# Patient Record
Sex: Female | Born: 1950 | Race: Black or African American | Hispanic: No | Marital: Single | Smoking: Former smoker
Health system: Southern US, Community
[De-identification: ages and names within clinical notes are randomized; demographics above are authoritative.]

## PROBLEM LIST (undated history)

## (undated) DIAGNOSIS — F200 Paranoid schizophrenia: Secondary | ICD-10-CM

## (undated) DIAGNOSIS — I10 Essential (primary) hypertension: Secondary | ICD-10-CM

## (undated) DIAGNOSIS — F319 Bipolar disorder, unspecified: Secondary | ICD-10-CM

---

## 1986-03-01 HISTORY — PX: BREAST LUMPECTOMY: SHX2

## 2014-10-18 ENCOUNTER — Emergency Department (HOSPITAL_COMMUNITY)
Admission: EM | Admit: 2014-10-18 | Discharge: 2014-10-24 | Payer: Self-pay | Attending: Emergency Medicine | Admitting: Emergency Medicine

## 2014-10-18 ENCOUNTER — Encounter (HOSPITAL_COMMUNITY): Payer: Self-pay | Admitting: Emergency Medicine

## 2014-10-18 DIAGNOSIS — Z049 Encounter for examination and observation for unspecified reason: Secondary | ICD-10-CM

## 2014-10-18 DIAGNOSIS — Z0389 Encounter for observation for other suspected diseases and conditions ruled out: Secondary | ICD-10-CM | POA: Insufficient documentation

## 2014-10-18 DIAGNOSIS — F200 Paranoid schizophrenia: Secondary | ICD-10-CM | POA: Diagnosis present

## 2014-10-18 DIAGNOSIS — Z8659 Personal history of other mental and behavioral disorders: Secondary | ICD-10-CM | POA: Insufficient documentation

## 2014-10-18 HISTORY — DX: Bipolar disorder, unspecified: F31.9

## 2014-10-18 LAB — COMPREHENSIVE METABOLIC PANEL
ALK PHOS: 83 U/L (ref 38–126)
ALT: 19 U/L (ref 14–54)
AST: 28 U/L (ref 15–41)
Albumin: 4.1 g/dL (ref 3.5–5.0)
Anion gap: 10 (ref 5–15)
BILIRUBIN TOTAL: 1.1 mg/dL (ref 0.3–1.2)
BUN: 20 mg/dL (ref 6–20)
CHLORIDE: 112 mmol/L — AB (ref 101–111)
CO2: 23 mmol/L (ref 22–32)
CREATININE: 1.29 mg/dL — AB (ref 0.44–1.00)
Calcium: 9.7 mg/dL (ref 8.9–10.3)
GFR calc Af Amer: 55 mL/min — ABNORMAL LOW (ref >60)
GFR, EST NON AFRICAN AMERICAN: 48 mL/min — AB (ref >60)
Glucose, Bld: 122 mg/dL — ABNORMAL HIGH (ref 65–99)
POTASSIUM: 3.8 mmol/L (ref 3.5–5.1)
Sodium: 145 mmol/L (ref 135–145)
Total Protein: 7.8 g/dL (ref 6.5–8.1)

## 2014-10-18 LAB — CBC
HCT: 38.7 % (ref 36.0–46.0)
HEMOGLOBIN: 13.2 g/dL (ref 12.0–15.0)
MCH: 27 pg (ref 26.0–34.0)
MCHC: 34.1 g/dL (ref 30.0–36.0)
MCV: 79.1 fL (ref 78.0–100.0)
Platelets: 145 10*3/uL — ABNORMAL LOW (ref 150–400)
RBC: 4.89 MIL/uL (ref 3.87–5.11)
RDW: 14.3 % (ref 11.5–15.5)
WBC: 7.2 10*3/uL (ref 4.0–10.5)

## 2014-10-18 LAB — ACETAMINOPHEN LEVEL: Acetaminophen (Tylenol), Serum: <10 ug/mL — ABNORMAL LOW (ref 10–30)

## 2014-10-18 LAB — ETHANOL: Alcohol, Ethyl (B): <5 mg/dL (ref <5)

## 2014-10-18 LAB — SALICYLATE LEVEL: Salicylate Lvl: <4 mg/dL (ref 2.8–30.0)

## 2014-10-18 MED ORDER — ZIPRASIDONE MESYLATE 20 MG IM SOLR
20.0000 mg | Freq: Once | INTRAMUSCULAR | Status: AC
  Administered 2014-10-18: 20 mg via INTRAMUSCULAR
  Filled 2014-10-18: qty 20

## 2014-10-18 MED ORDER — SODIUM CHLORIDE 0.9 % IJ SOLN
INTRAMUSCULAR | Status: AC
  Administered 2014-10-18: 1.2 mL
  Filled 2014-10-18: qty 10

## 2014-10-18 NOTE — ED Notes (Signed)
Bed: WA10 Expected date:  Expected time:  Means of arrival:  Comments: 

## 2014-10-18 NOTE — BH Assessment (Signed)
Assessment completed. Consulted Hulan Fess, NP who recommended inpatient treatment. TTS will contact facilities for placement. Attempted to contact Dr. Anitra Lauth but disposition message was left with Lorina Rabon, RN.

## 2014-10-18 NOTE — ED Notes (Signed)
Pt screaming about her vagina, and HIV/AIDS, said she was going to kick this nurses "ass".  Screaming at everyone. Being non cooperative.

## 2014-10-18 NOTE — ED Notes (Signed)
No blood draw from lab due to pt screaming and potiential violent

## 2014-10-18 NOTE — Progress Notes (Signed)
Patient listed as not having a pcp or insurance.  Currently patient is screaming in her room and making threats to staff.  EDCM will not see this patient at this time.

## 2014-10-18 NOTE — ED Provider Notes (Signed)
CSN: [id number]     Arrival date & time 10/18/14  1611 History   First MD Initiated Contact with Patient 10/18/14 1626     Chief Complaint  Patient presents with  . IVC      (Consider location/radiation/quality/duration/timing/severity/associated sxs/prior Treatment) HPI Comments: Patient being brought in by police under IVC commitment. She was attempting to bite the officers when they were trying to restrain her. She is verbally threatening to hurt others. She is currently noncooperative and screaming at everyone. Her son took out IVC paperwork for erratic behavior and concern that she may hurt herself or others. Based on the paperwork she talks to herself constantly making verbal threats. Previously she was committed in [facility] and she currently is taking no medication. Police found her walking down the street yelling and disturbing neighbors for no apparent reason.  The history is provided by the police and medical records. The history is limited by the condition of the patient.    Past Medical History  Diagnosis Date  . Bipolar 1 disorder    History reviewed. No pertinent past surgical history. No family history on file. Social History  Substance Use Topics  . Smoking status: None  . Smokeless tobacco: None  . Alcohol Use: None   OB History    No data available     Review of Systems  All other systems reviewed and are negative.     Allergies  Review of patient's allergies indicates not on file.  Home Medications   Prior to Admission medications   Not on File   BP 195/97 mmHg  Pulse 140  Temp(Src) 99.3 F (37.4 C) (Oral)  Resp 22  SpO2 100% Physical Exam  Constitutional: She appears well-developed and well-nourished.  Patient is angry, violent, refusing to let me do an exam  HENT:  Head: Normocephalic and atraumatic.  Mouth/Throat: Oropharynx is clear and moist.  Eyes: Conjunctivae and EOM are normal. Pupils are equal, round, and reactive  to light.  Neck: Normal range of motion. Neck supple.  Cardiovascular: Tachycardia present.   Pulmonary/Chest: Effort normal.  Musculoskeletal: Normal range of motion. She exhibits no edema or tenderness.  Neurological: She is alert. Gait abnormal.  Skin: Skin is warm and dry. No rash noted. No erythema.  Psychiatric: She has a normal mood and affect. Her speech is rapid and/or pressured and tangential. She is agitated, aggressive and actively hallucinating. She expresses impulsivity. She is noncommunicative.  Patient is sitting in the room talking with herself screaming at anyone comes into the room. Aggressively lunging at people when attempting to bite police officers.  Nursing note and vitals reviewed.   ED Course  Procedures (including critical care time) Labs Review Labs Reviewed  COMPREHENSIVE METABOLIC PANEL - Abnormal; Notable for the following:    Chloride 112 (*)    Glucose, Bld 122 (*)    Creatinine, Ser 1.29 (*)    GFR calc non Af Amer 48 (*)    GFR calc Af Amer 55 (*)    All other components within normal limits  ACETAMINOPHEN LEVEL - Abnormal; Notable for the following:    Acetaminophen (Tylenol), Serum <10 (*)    All other components within normal limits  CBC - Abnormal; Notable for the following:    Platelets 145 (*)    All other components within normal limits  ETHANOL  SALICYLATE LEVEL  URINE RAPID DRUG SCREEN, HOSP PERFORMED  POC URINE PREG, ED    Imaging Review No results found. I  have personally reviewed and evaluated these images and lab results as part of my medical decision-making.   EKG Interpretation None      MDM   Final diagnoses:  None    Patient is here under involuntary commitment by her son for erratic behavior and the potential for harming herself or others. Patient is here screaming and yelling and refusing to answer any questions. He appears to be having a psychotic episode and is unable to be redirected.  Patient will begin Geodon  as at this time we are unable to obtain any blood work or history. She is threatening nurses and aggressive.  8:29 PM Patient is more calm after receiving Geodon. Labs were obtained which appeared to be unremarkable. TTS consult pending and psych holding orders placed  Gwyneth Sprout, MD 10/18/14 2030

## 2014-10-18 NOTE — ED Notes (Signed)
Pt is verbally aggressive and making threats toward staff, security, and [employer] officers.  Pt attempted to bite officers as they were restraining her.  Pt's arms currently being handcuffed to bed.

## 2014-10-18 NOTE — BH Assessment (Addendum)
Tele Assessment Note   Jodi Evans is an 64 y.o. female presenting to [facility] unaccompanied after being petitioned by her son for involuntary committed due to displaying erratic behaviors and making verbal threats. Pt did not actively participate in this assessment. After responding "no" to this writing inquiring about suicidal ideations pt placed the blanket under her neck and did not respond to any additional questions. This Clinical research associate was unable to gather information from the petitioner at this time. A voice message was left for a return phone call.   Axis I: Bipolar 1 disorder by hx.   Past Medical History:  Past Medical History  Diagnosis Date  . Bipolar 1 disorder     History reviewed. No pertinent past surgical history.  Family History: No family history on file.  Social History:  has no tobacco, alcohol, and drug history on file.  Additional Social History:  Alcohol / Drug Use History of alcohol / drug use?:  (Unable to assess )  CIWA: CIWA-Ar BP: 116/67 mmHg Pulse Rate: 73 COWS:    PATIENT STRENGTHS: (choose at least two) Average or above average intelligence  Allergies: Allergies not on file  Home Medications:  (Not in a hospital admission)  OB/GYN Status:  No LMP recorded.  General Assessment Data Location of Assessment: [facility] TTS Assessment: In system Is this a Tele or Face-to-Face Assessment?: Face-to-Face Is this an Initial Assessment or a Re-assessment for this encounter?: Initial Assessment Marital status:  (Unable to assess ) Maiden name:  (Unable to assess ) Is patient pregnant?:  (Unable to assess ) Pregnancy Status: Unable to assess Living Arrangements:  (Unable to assess ) Can pt return to current living arrangement?:  (Unable to assess ) Admission Status: Involuntary Is patient capable of signing voluntary admission?:  (Unable to assess ) Referral Source: Self/Family/Friend Insurance type: Unknown      Crisis Care Plan Living Arrangements:   (Unable to assess ) Name of Psychiatrist:  (Unable to assess ) Name of Therapist:  (Unable to assess )  Education Status Is patient currently in school?: No Current Grade: N/A Highest grade of school patient has completed: N/A Name of school: N/A Contact person: N/A  Risk to self with the past 6 months Suicidal Ideation: No Has patient been a risk to self within the past 6 months prior to admission? :  (Unable to assess ) Suicidal Intent:  (Unable to assess ) Has patient had any suicidal intent within the past 6 months prior to admission? :  (Unable to assess ) Is patient at risk for suicide?:  (Unable to assess ) Suicidal Plan?:  (Unable to assess ) Has patient had any suicidal plan within the past 6 months prior to admission? :  (Unable to assess ) Access to Means:  (Unable to assess ) What has been your use of drugs/alcohol within the last 12 months?:  (Unable to assess ) Previous Attempts/Gestures:  (Unable to assess ) How many times?:  (Unable to assess ) Other Self Harm Risks:  (Unable to assess ) Triggers for Past Attempts:  (Unable to assess ) Intentional Self Injurious Behavior:  (Unable to assess ) Family Suicide History: Unable to assess Recent stressful life event(s):  (Unable to assess ) Persecutory voices/beliefs?:  (Unable to assess ) Depression:  (Unable to assess ) Depression Symptoms:  (Unable to assess ) Substance abuse history and/or treatment for substance abuse?:  (Unable to assess )  Risk to Others within the past 6 months Homicidal Ideation:  (Unable  to assess ) Does patient have any lifetime risk of violence toward others beyond the six months prior to admission? :  (Unable to assess ) Thoughts of Harm to Others:  (Unable to assess ) Current Homicidal Intent:  (Unable to assess ) Current Homicidal Plan:  (Unable to assess ) Access to Homicidal Means:  (Unable to assess ) Identified Victim:  (Unable to assess ) History of harm to others?:  (Unable to  assess ) Assessment of Violence: None Noted Violent Behavior Description: No violent behaviors observed at this time. Pt is drowsy.  Does patient have access to weapons?:  (Unable to assess ) Criminal Charges Pending?:  (Unable to assess) Describe Pending Criminal Charges:  (Unable to assess ) Does patient have a court date:  (Unknown ) Is patient on probation?: Unknown  Psychosis Hallucinations:  (Unable to assess ) Delusions:  (Unable to assess )  Mental Status Report Appearance/Hygiene: In scrubs Eye Contact: Poor Motor Activity: Unable to assess Speech: Unable to assess Level of Consciousness: Drowsy (It has been reported that pt was given Geodon at 1652. ) Mood:  (Unable to assess ) Affect: Unable to Assess Anxiety Level:  (Unable to assess ) Thought Processes: Unable to Assess Judgement: Unable to Assess Orientation: Unable to assess Obsessive Compulsive Thoughts/Behaviors: Unable to Assess  Cognitive Functioning Concentration: Unable to Assess Memory: Unable to Assess IQ:  (Unable to assess ) Insight: Unable to Assess Impulse Control: Unable to Assess Appetite:  (Unable to assess ) Weight Loss:  (Unable to assess ) Weight Gain:  (Unable to assess ) Sleep: Unable to Assess Total Hours of Sleep:  (Unable to assess ) Vegetative Symptoms: Unable to Assess  ADLScreening [facility] Assessment Services) Patient's cognitive ability adequate to safely complete daily activities?:  (Unable to assess ) Patient able to express need for assistance with ADLs?:  (Unable to assess ) Independently performs ADLs?:  (Unable to assess )  Prior Inpatient Therapy Prior Inpatient Therapy:  (Unable to assess ) Prior Therapy Dates:  (Unable to assess ) Prior Therapy Facilty/Provider(s):  (Unable to assess ) Reason for Treatment:  (Unable to assess )  Prior Outpatient Therapy Prior Outpatient Therapy:  (Unable to assess ) Prior Therapy Dates:  (Unable to assess ) Prior Therapy  Facilty/Provider(s):  (Unable to assess) Reason for Treatment:  (Unable to assess ) Does patient have an ACCT team?: Unknown Does patient have Intensive In-House Services?  : No Does patient have [facility] services? : Unknown Does patient have P4CC services?: Unknown  ADL Screening (condition at time of admission) Patient's cognitive ability adequate to safely complete daily activities?:  (Unable to assess ) Patient able to express need for assistance with ADLs?:  (Unable to assess ) Independently performs ADLs?:  (Unable to assess )       Abuse/Neglect Assessment (Assessment to be complete while patient is alone) Physical Abuse:  (Unable to assess ) Verbal Abuse:  (Unable to assess ) Sexual Abuse:  (Unable to assess ) Exploitation of patient/patient's resources:  (Unable to assess ) Self-Neglect:  (Unable to assess )     Advance Directives (For Healthcare) Does patient have an advance directive?: No Would patient like information on creating an advanced directive?: No - patient declined information    Additional Information 1:1 In Past 12 Months?: No CIRT Risk: No Elopement Risk: No Does patient have medical clearance?: Yes     Disposition:  Disposition Initial Assessment Completed for this Encounter: Yes  Markell Schrier S 10/18/2014 9:14 PM

## 2014-10-18 NOTE — ED Notes (Signed)
Pt brought in by [employer] under IVC papers that were taken out by pt's son.   Papers state: "she is a danger to harm herself and or others.  Erratic behavior could potentially be harmful to herself or others.  She talks to herself constantly.  Makes verbal threats.  Bi-polar.  She has been previously committed in [place], [place].  She will not take her medication that she needs.  Police were called on her because of her yelling and disturbing the neighbors for no apparent reason."

## 2014-10-19 DIAGNOSIS — F29 Unspecified psychosis not due to a substance or known physiological condition: Secondary | ICD-10-CM

## 2014-10-19 DIAGNOSIS — F200 Paranoid schizophrenia: Secondary | ICD-10-CM | POA: Diagnosis present

## 2014-10-19 LAB — RAPID URINE DRUG SCREEN, HOSP PERFORMED
AMPHETAMINES: NOT DETECTED
BENZODIAZEPINES: NOT DETECTED
Barbiturates: NOT DETECTED
Cocaine: NOT DETECTED
OPIATES: NOT DETECTED
TETRAHYDROCANNABINOL: NOT DETECTED

## 2014-10-19 LAB — PREGNANCY, URINE: PREG TEST UR: NEGATIVE

## 2014-10-19 MED ORDER — LORAZEPAM 1 MG PO TABS
1.0000 mg | ORAL_TABLET | Freq: Three times a day (TID) | ORAL | Status: DC | PRN
  Filled 2014-10-19 (×2): qty 1

## 2014-10-19 MED ORDER — STERILE WATER FOR INJECTION IJ SOLN
INTRAMUSCULAR | Status: AC
  Administered 2014-10-19: 08:00:00
  Filled 2014-10-19: qty 10

## 2014-10-19 MED ORDER — ONDANSETRON HCL 4 MG PO TABS: 4.0000 mg | ORAL_TABLET | Freq: Three times a day (TID) | ORAL | Status: DC | PRN

## 2014-10-19 MED ORDER — ALUM & MAG HYDROXIDE-SIMETH 200-200-20 MG/5ML PO SUSP: 30.0000 mL | ORAL | Status: DC | PRN

## 2014-10-19 MED ORDER — ZIPRASIDONE MESYLATE 20 MG IM SOLR: 20.0000 mg | Freq: Once | INTRAMUSCULAR | Status: DC

## 2014-10-19 MED ORDER — ZIPRASIDONE MESYLATE 20 MG IM SOLR
20.0000 mg | Freq: Once | INTRAMUSCULAR | Status: AC
  Administered 2014-10-19: 20 mg via INTRAMUSCULAR
  Filled 2014-10-19: qty 20

## 2014-10-19 NOTE — ED Notes (Signed)
Per Nicanor Alcon, MD due to pt's quickly escalating behavior, will medicate pt and obtain EKG afterwards when pt is calmer. Pt yelling at staff, pacing, raising arms and fists to staff and telling staff that "you are not part of my 1970 student class, so get out." Pt manipulative, refusing to speak with certain staff members after they upset her.

## 2014-10-19 NOTE — Consult Note (Addendum)
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   Total Time spent with patient: 30 minutes  Subjective:   Jodi Evans is a 64 y.o. female patient admitted with threatening behavior.  HPI:  Patient is 64 year old African-American female who was admitted under IVC petition by her son as patient is displaying erratic behavior and making verbal threats.  Patient appears very psychotic, paranoid and labile.  She did not provide any information but mentioned police took her for no reason.  She was noticed giggling and laughing and making gestures.  She responded to the question with either yes or no reply.  Staff also tried to get information however unsuccessful.  A voicemail message was left to her son for more information.  Past Medical History:  Past Medical History  Diagnosis Date  . Bipolar 1 disorder    History reviewed. No pertinent past surgical history. Family History: No family history on file. Social History:  History  Alcohol Use: Not on file     History  Drug Use Not on file    Social History   Social History  . Marital Status: Single    Spouse Name: N/A  . Number of Children: N/A  . Years of Education: N/A   Social History Main Topics  . Smoking status: None  . Smokeless tobacco: None  . Alcohol Use: None  . Drug Use: None  . Sexual Activity: Not Asked   Other Topics Concern  . None   Social History Narrative  . None   Additional Social History:    History of alcohol / drug use?:  (Unable to assess )                     Allergies:   Allergies  Allergen Reactions  . Shellfish Allergy     Labs:  Results for orders placed or performed during the hospital  encounter of 10/18/14 (from the past 48 hour(s))  Comprehensive metabolic panel     Status: Abnormal   Collection Time: 10/18/14  4:15 PM  Result Value Ref Range   Sodium 145 135 - 145 mmol/L   Potassium 3.8 3.5 - 5.1 mmol/L   Chloride 112 (H) 101 - 111 mmol/L   CO2 23 22 - 32 mmol/L   Glucose, Bld 122 (H) 65 - 99 mg/dL   BUN 20 6 - 20 mg/dL   Creatinine, Ser 1.29 (H) 0.44 - 1.00 mg/dL   Calcium 9.7 8.9 - 10.3 mg/dL   Total Protein 7.8 6.5 - 8.1 g/dL   Albumin 4.1 3.5 - 5.0 g/dL   AST 28 15 - 41 U/L   ALT 19 14 - 54 U/L   Alkaline Phosphatase 83 38 - 126 U/L   Total Bilirubin 1.1 0.3 - 1.2 mg/dL   GFR calc non Af Amer 48 (L) >60 mL/min   GFR calc Af Amer 55 (L) >60 mL/min    Comment: (NOTE) The eGFR has been calculated using the CKD EPI equation. This calculation has not been validated in all clinical situations. eGFR's persistently <60 mL/min signify possible Chronic Kidney Disease.    Anion gap 10 5 - 15  CBC     Status: Abnormal   Collection Time: 10/18/14  4:15 PM  Result Value Ref Range  WBC 7.2 4.0 - 10.5 K/uL   RBC 4.89 3.87 - 5.11 MIL/uL   Hemoglobin 13.2 12.0 - 15.0 g/dL   HCT 38.7 36.0 - 46.0 %   MCV 79.1 78.0 - 100.0 fL   MCH 27.0 26.0 - 34.0 pg   MCHC 34.1 30.0 - 36.0 g/dL   RDW 14.3 11.5 - 15.5 %   Platelets 145 (L) 150 - 400 K/uL    Comment: SPECIMEN CHECKED FOR CLOTS REPEATED TO VERIFY   Ethanol (ETOH)     Status: None   Collection Time: 10/18/14  6:46 PM  Result Value Ref Range   Alcohol, Ethyl (B) <5 <5 mg/dL    Comment:        LOWEST DETECTABLE LIMIT FOR SERUM ALCOHOL IS 5 mg/dL FOR MEDICAL PURPOSES ONLY   Salicylate level     Status: None   Collection Time: 10/18/14  6:46 PM  Result Value Ref Range   Salicylate Lvl <4.1 2.8 - 30.0 mg/dL  Acetaminophen level     Status: Abnormal   Collection Time: 10/18/14  6:46 PM  Result Value Ref Range   Acetaminophen (Tylenol), Serum <10 (L) 10 - 30 ug/mL    Comment:        THERAPEUTIC CONCENTRATIONS  VARY SIGNIFICANTLY. A RANGE OF 10-30 ug/mL MAY BE AN EFFECTIVE CONCENTRATION FOR MANY PATIENTS. HOWEVER, SOME ARE BEST TREATED AT CONCENTRATIONS OUTSIDE THIS RANGE. ACETAMINOPHEN CONCENTRATIONS >150 ug/mL AT 4 HOURS AFTER INGESTION AND >50 ug/mL AT 12 HOURS AFTER INGESTION ARE OFTEN ASSOCIATED WITH TOXIC REACTIONS.   Urine rapid drug screen (hosp performed) (Not at [facility])     Status: None   Collection Time: 10/19/14 10:00 AM  Result Value Ref Range   Opiates NONE DETECTED NONE DETECTED   Cocaine NONE DETECTED NONE DETECTED   Benzodiazepines NONE DETECTED NONE DETECTED   Amphetamines NONE DETECTED NONE DETECTED   Tetrahydrocannabinol NONE DETECTED NONE DETECTED   Barbiturates NONE DETECTED NONE DETECTED    Comment:        DRUG SCREEN FOR MEDICAL PURPOSES ONLY.  IF CONFIRMATION IS NEEDED FOR ANY PURPOSE, NOTIFY LAB WITHIN 5 DAYS.        LOWEST DETECTABLE LIMITS FOR URINE DRUG SCREEN Drug Class       Cutoff (ng/mL) Amphetamine      1000 Barbiturate      200 Benzodiazepine   962 Tricyclics       229 Opiates          300 Cocaine          300 THC              50   Pregnancy, urine     Status: None   Collection Time: 10/19/14 10:00 AM  Result Value Ref Range   Preg Test, Ur NEGATIVE NEGATIVE    Comment:        THE SENSITIVITY OF THIS METHODOLOGY IS >20 mIU/mL.     Vitals: Blood pressure 146/90, pulse 86, temperature 97.6 F (36.4 C), temperature source Oral, resp. rate 18, SpO2 100 %.  Risk to Self: Suicidal Ideation: No Suicidal Intent:  (Unable to assess ) Is patient at risk for suicide?:  (Unable to assess ) Suicidal Plan?:  (Unable to assess ) Access to Means:  (Unable to assess ) What has been your use of drugs/alcohol within the last 12 months?:  (Unable to assess ) How many times?:  (Unable to assess ) Other Self Harm Risks:  (Unable to assess ) Triggers for  Past Attempts:  (Unable to assess ) Intentional Self Injurious Behavior:  (Unable to assess ) Risk  to Others: Homicidal Ideation:  (Unable to assess ) Thoughts of Harm to Others:  (Unable to assess ) Current Homicidal Intent:  (Unable to assess ) Current Homicidal Plan:  (Unable to assess ) Access to Homicidal Means:  (Unable to assess ) Identified Victim:  (Unable to assess ) History of harm to others?:  (Unable to assess ) Assessment of Violence: None Noted Violent Behavior Description: No violent behaviors observed at this time. Pt is drowsy.  Does patient have access to weapons?:  (Unable to assess ) Criminal Charges Pending?:  (Unable to assess) Describe Pending Criminal Charges:  (Unable to assess ) Does patient have a court date:  (Unknown ) Prior Inpatient Therapy: Prior Inpatient Therapy:  (Unable to assess ) Prior Therapy Dates:  (Unable to assess ) Prior Therapy Facilty/Provider(s):  (Unable to assess ) Reason for Treatment:  (Unable to assess ) Prior Outpatient Therapy: Prior Outpatient Therapy:  (Unable to assess ) Prior Therapy Dates:  (Unable to assess ) Prior Therapy Facilty/Provider(s):  (Unable to assess) Reason for Treatment:  (Unable to assess ) Does patient have an ACCT team?: Unknown Does patient have Intensive In-House Services?  : No Does patient have [facility] services? : Unknown Does patient have P4CC services?: Unknown  Current Facility-Administered Medications  Medication Dose Route Frequency Provider Last Rate Last Dose  . alum & mag hydroxide-simeth (MAALOX/MYLANTA) 200-200-20 MG/5ML suspension 30 mL  30 mL Oral PRN April Palumbo, MD      . LORazepam (ATIVAN) tablet 1 mg  1 mg Oral Q8H PRN April Palumbo, MD      . ondansetron [facility]) tablet 4 mg  4 mg Oral Q8H PRN April Palumbo, MD       No current outpatient prescriptions on file.    Musculoskeletal: Strength & Muscle Tone: decreased Gait & Station: normal Patient leans: N/A  Psychiatric Specialty Exam: Physical Exam  ROS  Blood pressure 146/90, pulse 86, temperature 97.6 F (36.4 C),  temperature source Oral, resp. rate 18, SpO2 100 %.There is no height or weight on file to calculate BMI.  General Appearance: Bizarre, Disheveled and Guarded  Eye Contact::  Poor  Speech:  Slow and Slurred  Volume:  Decreased  Mood:  Irritable  Affect:  Inappropriate and Labile  Thought Process:  Circumstantial, Disorganized, Irrelevant, Loose and Tangential  Orientation:  Negative  Thought Content:  Hallucinations: Responding to internal stimuli, Rumination and Inappropriate gesture with laughing and giggling  Suicidal Thoughts:  No  Homicidal Thoughts:  Denies  Memory:  Immediate;   Poor Recent;   Poor Remote;   Poor  Judgement:  Impaired  Insight:  Lacking  Psychomotor Activity:  Decreased and Restlessness  Concentration:  Poor  Recall:  Poor  Fund of Knowledge:Poor  Language: Poor  Akathisia:  No  Handed:  Right  AIMS (if indicated):     Assets:  Housing  ADL's:  Impaired  Cognition: Impaired,  Moderate  Sleep:      Medical Decision Making: Review of Psycho-Social Stressors (1), Review or order clinical lab tests (1), Decision to obtain old records (1), Review and summation of old records (2), Established Problem, Worsening (2) and Review of Medication Regimen & Side Effects (2)  Treatment Plan Summary: Medication management and Patient requires inpatient psychiatric services.  Increase collateral information from her son.  Plan:  Recommend psychiatric Inpatient admission when medically cleared. review blood work results  and current medication. Disposition: Inpatient psychiatric services for stabilization and treatment for the psychosis.  Quiera Diffee T. 10/19/2014 2:39 PM

## 2014-10-19 NOTE — Progress Notes (Addendum)
Pt was brought over from the main ER. She appears very dishelved and manic.Pt stated,"I was yelling at home and the police showed up." Pt is cooperative and pleasant and did permit the nurse to take her BP and temp. She does contract for safety and denies SI and HI. 6pm-Pt laughs inappropraitely . She denies hearing any voices and when asked states, "only your voice." pt has been sitting in the hall with her 1;1 bedside her.

## 2014-10-19 NOTE — ED Notes (Signed)
Attempted to take patients vital signs again, she let me put the blood pressure cuff on her arm but then told me to take it off and started to raise her voice and hitting at the cuff. While I was in the room she was also having a conversation with someone who wasn't there.

## 2014-10-19 NOTE — ED Notes (Signed)
Resting quietly with eyes closed. Easily arousable. Verbally responsive. Resp even and unlabored. ABC's intact. No behavior problems noted. NAD noted. Sitter at bedside. 

## 2014-10-19 NOTE — Progress Notes (Signed)
Disposition CSW completed patient referrals to the following inpatient psych facilities:  [facility]. [facility]  CSW will continue to assist with patient placement.  [facility] Spe[facility] Disposition CSW [phone]

## 2014-10-19 NOTE — ED Notes (Signed)
Pt refused to have vital's taken said she had them taken earlier and doesn't want them taken again. Also referred to herself as "Dr. Dareen Piano".

## 2014-10-19 NOTE — ED Notes (Signed)
Pt was instructed to leave urine in hat but pt hurriedly took hat off commode and dumped her urine out.

## 2014-10-19 NOTE — ED Notes (Signed)
Meds were given. Once pt calms, EKG and updated vitals with be done. Will also notify pt we need urine sample.

## 2014-10-19 NOTE — ED Notes (Signed)
Resting quietly with eye closed. Easily arousable. Verbally responsive. Resp even and unlabored. ABC's intact. No behavior problems noted. NAD noted. Sitter at bedside. Pt awaken to speak with psych MD and mental health.

## 2014-10-19 NOTE — ED Notes (Signed)
Awake. Verbally responsive. Resp even and unlabored. ABC's intact. Pt refusing to speak with staff and stated that she will only speak with the doctor. Pt OOB sitting in chair. Sitter at bedside.

## 2014-10-19 NOTE — ED Notes (Signed)
Called report to Marylu Lund, Charity fundraiser. Pt moved to TCU 31.

## 2014-10-19 NOTE — ED Provider Notes (Signed)
9:29 AM Patient resting comfortably after geodon. EKG shows no acute ischemia. QTC 444   EKG Interpretation  Date/Time:  Friday October 18 2014 19:29:51 EDT Ventricular Rate:  68 PR Interval:  190 QRS Duration: 87 QT Interval:  417 QTC Calculation: 443 R Axis:   19 Text Interpretation:  Sinus rhythm Borderline T wave abnormalities No old tracing to compare Confirmed by Ivannah Zody  MD, Laurieanne Galloway ([id number]) on 10/19/2014 9:29:04 AM        Pricilla Loveless, MD 10/19/14 [phone]

## 2014-10-20 DIAGNOSIS — F29 Unspecified psychosis not due to a substance or known physiological condition: Secondary | ICD-10-CM | POA: Insufficient documentation

## 2014-10-20 NOTE — ED Notes (Signed)
MD requested notify family for patient meds.  Spoke to son Junior and meds unknown.  However given number to sister who states last meds in Feb were cogentin, prozac and haldol IM.  Family did not know exact dosing or if meds were ever filled.  Pt was last seen in [place] in [facility].  Notified NP of findings.

## 2014-10-20 NOTE — ED Notes (Signed)
Pt still fully awake at this time--- coming in and out of room frequently.  Was offered Ativan to help her relax--- pt declined; stated, "I am fully relaxed".

## 2014-10-20 NOTE — ED Notes (Signed)
Pt. Noted sleeping in room. No complaints or concerns voiced. No distress or abnormal behavior noted. Will continue to monitor with security cameras. Q 15 minute rounds continue. 

## 2014-10-20 NOTE — Consult Note (Signed)
[facility] Follow Up Note     Patient Identification: Makayla Lanter MRN:  [id number] Principal Diagnosis: Psychosis, rule out schizophrenia chronic paranoid type Diagnosis:   Patient Active Problem List   Diagnosis Date Noted  . Psychosis [F29] 10/19/2014  . 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 female patient admitted with threatening behavior.  Objective: Patient seen chart reviewed.  Patient remains very guarded, paranoid and refused to cooperate to provide more information.  She's been refusing vitals and does not want to take any psychiatric medication.  When I ask why she was petitioned by her son, patient replied go ask him.  I asked if she has taken psychotrauma medication in the past, patient replied I was seeing someone at [facility] but I don't think I have any psychiatric problem.  Patient endorse that there is nothing wrong with her.  She minimizes the circumstances that led her to come emergency room.  Patient remains very labile, easily irritable, seen giggling and laughing to herself.  She is refusing to cooperate to provide more information.  We still waiting for collateral information from her son.  Past Medical History:  Past Medical History  Diagnosis Date  . Bipolar 1 disorder    History reviewed. No pertinent past surgical history. Family History: No family history on file. Social History:  History  Alcohol Use: Not on file     History  Drug Use Not on file    Social History   Social History  . Marital Status: Single    Spouse Name: N/A  . Number of Children: N/A  . Years of Education: N/A   Social History Main Topics  . Smoking status: None  . Smokeless tobacco: None  . Alcohol Use: None  . Drug Use: None  . Sexual Activity: Not Asked   Other Topics Concern  . None   Social History Narrative  . None   Additional Social History:    History of alcohol / drug use?:   (Unable to assess )                     Allergies:   Allergies  Allergen Reactions  . Shellfish Allergy     Labs:  Results for orders placed or performed during the hospital encounter of 10/18/14 (from the past 48 hour(s))  Comprehensive metabolic panel     Status: Abnormal   Collection Time: 10/18/14  4:15 PM  Result Value Ref Range   Sodium 145 135 - 145 mmol/L   Potassium 3.8 3.5 - 5.1 mmol/L   Chloride 112 (H) 101 - 111 mmol/L   CO2 23 22 - 32 mmol/L   Glucose, Bld 122 (H) 65 - 99 mg/dL   BUN 20 6 - 20 mg/dL   Creatinine, Ser 1.29 (H) 0.44 - 1.00 mg/dL   Calcium 9.7 8.9 - 10.3 mg/dL   Total Protein 7.8 6.5 - 8.1 g/dL   Albumin 4.1 3.5 - 5.0 g/dL   AST 28 15 - 41 U/L   ALT 19 14 - 54 U/L   Alkaline Phosphatase 83 38 - 126 U/L   Total Bilirubin 1.1 0.3 - 1.2 mg/dL   GFR calc non Af Amer 48 (L) >60 mL/min   GFR calc Af Amer 55 (L) >60 mL/min    Comment: (NOTE) The eGFR has been calculated using the CKD EPI equation. This calculation has not been validated  in all clinical situations. eGFR's persistently <60 mL/min signify possible Chronic Kidney Disease.    Anion gap 10 5 - 15  CBC     Status: Abnormal   Collection Time: 10/18/14  4:15 PM  Result Value Ref Range   WBC 7.2 4.0 - 10.5 K/uL   RBC 4.89 3.87 - 5.11 MIL/uL   Hemoglobin 13.2 12.0 - 15.0 g/dL   HCT 38.7 36.0 - 46.0 %   MCV 79.1 78.0 - 100.0 fL   MCH 27.0 26.0 - 34.0 pg   MCHC 34.1 30.0 - 36.0 g/dL   RDW 14.3 11.5 - 15.5 %   Platelets 145 (L) 150 - 400 K/uL    Comment: SPECIMEN CHECKED FOR CLOTS REPEATED TO VERIFY   Ethanol (ETOH)     Status: None   Collection Time: 10/18/14  6:46 PM  Result Value Ref Range   Alcohol, Ethyl (B) <5 <5 mg/dL    Comment:        LOWEST DETECTABLE LIMIT FOR SERUM ALCOHOL IS 5 mg/dL FOR MEDICAL PURPOSES ONLY   Salicylate level     Status: None   Collection Time: 10/18/14  6:46 PM  Result Value Ref Range   Salicylate Lvl <9.8 2.8 - 30.0 mg/dL  Acetaminophen  level     Status: Abnormal   Collection Time: 10/18/14  6:46 PM  Result Value Ref Range   Acetaminophen (Tylenol), Serum <10 (L) 10 - 30 ug/mL    Comment:        THERAPEUTIC CONCENTRATIONS VARY SIGNIFICANTLY. A RANGE OF 10-30 ug/mL MAY BE AN EFFECTIVE CONCENTRATION FOR MANY PATIENTS. HOWEVER, SOME ARE BEST TREATED AT CONCENTRATIONS OUTSIDE THIS RANGE. ACETAMINOPHEN CONCENTRATIONS >150 ug/mL AT 4 HOURS AFTER INGESTION AND >50 ug/mL AT 12 HOURS AFTER INGESTION ARE OFTEN ASSOCIATED WITH TOXIC REACTIONS.   Urine rapid drug screen (hosp performed) (Not at [facility])     Status: None   Collection Time: 10/19/14 10:00 AM  Result Value Ref Range   Opiates NONE DETECTED NONE DETECTED   Cocaine NONE DETECTED NONE DETECTED   Benzodiazepines NONE DETECTED NONE DETECTED   Amphetamines NONE DETECTED NONE DETECTED   Tetrahydrocannabinol NONE DETECTED NONE DETECTED   Barbiturates NONE DETECTED NONE DETECTED    Comment:        DRUG SCREEN FOR MEDICAL PURPOSES ONLY.  IF CONFIRMATION IS NEEDED FOR ANY PURPOSE, NOTIFY LAB WITHIN 5 DAYS.        LOWEST DETECTABLE LIMITS FOR URINE DRUG SCREEN Drug Class       Cutoff (ng/mL) Amphetamine      1000 Barbiturate      200 Benzodiazepine   338 Tricyclics       250 Opiates          300 Cocaine          300 THC              50   Pregnancy, urine     Status: None   Collection Time: 10/19/14 10:00 AM  Result Value Ref Range   Preg Test, Ur NEGATIVE NEGATIVE    Comment:        THE SENSITIVITY OF THIS METHODOLOGY IS >20 mIU/mL.     Vitals: Blood pressure 150/88, pulse 71, temperature 98.3 F (36.8 C), temperature source Oral, resp. rate 19, SpO2 100 %.  Risk to Self: Suicidal Ideation: No Suicidal Intent:  (Unable to assess ) Is patient at risk for suicide?:  (Unable to assess ) Suicidal Plan?:  (Unable to assess )  Access to Means:  (Unable to assess ) What has been your use of drugs/alcohol within the last 12 months?:  (Unable to assess ) How  many times?:  (Unable to assess ) Other Self Harm Risks:  (Unable to assess ) Triggers for Past Attempts:  (Unable to assess ) Intentional Self Injurious Behavior:  (Unable to assess ) Risk to Others: Homicidal Ideation:  (Unable to assess ) Thoughts of Harm to Others:  (Unable to assess ) Current Homicidal Intent:  (Unable to assess ) Current Homicidal Plan:  (Unable to assess ) Access to Homicidal Means:  (Unable to assess ) Identified Victim:  (Unable to assess ) History of harm to others?:  (Unable to assess ) Assessment of Violence: None Noted Violent Behavior Description: No violent behaviors observed at this time. Pt is drowsy.  Does patient have access to weapons?:  (Unable to assess ) Criminal Charges Pending?:  (Unable to assess) Describe Pending Criminal Charges:  (Unable to assess ) Does patient have a court date:  (Unknown ) Prior Inpatient Therapy: Prior Inpatient Therapy:  (Unable to assess ) Prior Therapy Dates:  (Unable to assess ) Prior Therapy Facilty/Provider(s):  (Unable to assess ) Reason for Treatment:  (Unable to assess ) Prior Outpatient Therapy: Prior Outpatient Therapy:  (Unable to assess ) Prior Therapy Dates:  (Unable to assess ) Prior Therapy Facilty/Provider(s):  (Unable to assess) Reason for Treatment:  (Unable to assess ) Does patient have an ACCT team?: Unknown Does patient have Intensive In-House Services?  : No Does patient have [facility] services? : Unknown Does patient have P4CC services?: Unknown  Current Facility-Administered Medications  Medication Dose Route Frequency Provider Last Rate Last Dose  . alum & mag hydroxide-simeth (MAALOX/MYLANTA) 200-200-20 MG/5ML suspension 30 mL  30 mL Oral PRN April Palumbo, MD      . LORazepam (ATIVAN) tablet 1 mg  1 mg Oral Q8H PRN April Palumbo, MD      . ondansetron [facility]) tablet 4 mg  4 mg Oral Q8H PRN April Palumbo, MD      . ziprasidone (GEODON) injection 20 mg  20 mg Intramuscular Once Lacretia Leigh, MD   Stopped at 10/19/14 2150   No current outpatient prescriptions on file.    Musculoskeletal: Strength & Muscle Tone: decreased Gait & Station: normal Patient leans: N/A  Psychiatric Specialty Exam: Physical Exam  ROS  Blood pressure 150/88, pulse 71, temperature 98.3 F (36.8 C), temperature source Oral, resp. rate 19, SpO2 100 %.There is no height or weight on file to calculate BMI.  General Appearance: Bizarre, Disheveled and Guarded  Eye Contact::  Fair  Speech:  Slow  Volume:  Decreased  Mood:  Irritable  Affect:  Inappropriate and Labile  Thought Process:  Circumstantial, Disorganized, Irrelevant, Loose and Tangential  Orientation:  Negative  Thought Content:  Hallucinations: Responding to internal stimuli, Rumination and Inappropriate gesture with laughing and giggling  Suicidal Thoughts:  No  Homicidal Thoughts:  Denies  Memory:  Immediate;   Poor Recent;   Poor Remote;   Poor  Judgement:  Impaired  Insight:  Lacking  Psychomotor Activity:  Decreased and Restlessness  Concentration:  Poor  Recall:  Poor  Fund of Knowledge:Poor  Language: Poor  Akathisia:  No  Handed:  Right  AIMS (if indicated):     Assets:  Housing  ADL's:  Impaired  Cognition: Impaired,  Moderate  Sleep:      Medical Decision Making: Review of Psycho-Social Stressors (1), Review or order  clinical lab tests (1), Decision to obtain old records (1), Review and summation of old records (2) and Review of Medication Regimen & Side Effects (2)  Treatment Plan Summary: Review blood work results.  Patient remains very psychotic, paranoid and labile.  Still awaiting collateral information from her son.  Patient requires inpatient psychiatric treatment when she is medically cleared.  Plan:  Recommend psychiatric Inpatient admission when medically cleared.  Disposition: Inpatient psychiatric services for stabilization and treatment for the psychosis.  ARFEEN,SYED T. 10/20/2014 2:01 PM

## 2014-10-20 NOTE — [facility] Counselor (Signed)
[facility] Assessment Progress Note  Counselor reassessed patient this morning. She denied SI, HI, and AVH. She shared that she wanted to go home and that she was forced to come here. When pt discovered that counselor could not discharge pt, she became slightly irritated and asked "then what you counseling me for?". She also stated "I just want to talk to the doctor, then".   Consulted with Dr. Lolly Mustache and Julieanne Cotton, NP, who indicates that pt continues to meet IP criteria due to her behavior less than 24 hours prior. They would like pt's son to be contacted for collateral info.   Johny Shock. Ladona Ridgel, MS, NCC, LPCA Counselor

## 2014-10-20 NOTE — ED Notes (Signed)
Pt. Refuse morning vital signs RN notified

## 2014-10-20 NOTE — ED Notes (Signed)
Pt. To [facility] from ED ambulatory without difficulty, to room 37 . Report from [facility]. Pt. Is alert and oriented, warm and dry in no distress. Pt. Denies SI, HI, and AVH. Pt. Calm. Pt. Made aware of security cameras and Q15 minute rounds. Pt. Encouraged to let Nursing staff know of any concerns or needs.

## 2014-10-20 NOTE — ED Notes (Signed)
Pt refused vital signs taken; emphatically stated, "No!".

## 2014-10-21 ENCOUNTER — Emergency Department (HOSPITAL_COMMUNITY): Payer: Self-pay

## 2014-10-21 MED ORDER — OLANZAPINE 10 MG PO TBDP: 10.0000 mg | ORAL_TABLET | Freq: Three times a day (TID) | ORAL | Status: DC | PRN

## 2014-10-21 MED ORDER — BENZTROPINE MESYLATE 1 MG PO TABS
0.5000 mg | ORAL_TABLET | Freq: Two times a day (BID) | ORAL | Status: DC
  Filled 2014-10-21 (×2): qty 1

## 2014-10-21 MED ORDER — RISPERIDONE 0.5 MG PO TABS
0.5000 mg | ORAL_TABLET | Freq: Two times a day (BID) | ORAL | Status: DC
  Filled 2014-10-21 (×2): qty 1

## 2014-10-21 NOTE — ED Notes (Signed)
Pt laying in bed refusing to acknowledge x-ray tec and nurse.  A few minutes later pt. Was seen up in bathroom wide awake.

## 2014-10-21 NOTE — Consult Note (Signed)
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 Psychoses [F29]   . Psychosis [F29] 10/19/2014  . Paranoid schizophrenia [F20.0]     Total Time spent with patient: 15 minutes  Subjective:   Jodi Evans is a 64 y.o. female patient admitted with threatening behavior. Pt seen and chart reviewed with NP/MD team this morning. Pt continues to present with paranoid ideation. However, she denies suicidal/homicidal ideation and psychosis, from her perspective. She was very guarded and vague and irritable when asked assessment questions. Pt continues to meet inpatient criteria. Affect non-congruent.   Objective: Patient seen chart reviewed.  Patient remains very guarded, paranoid and refused to cooperate to provide more information.  She's been refusing vitals and does not want to take any psychiatric medication.  When I ask why she was petitioned by her son, patient replied go ask him.  I asked if she has taken psychotrauma medication in the past, patient replied I was seeing someone at [facility] but I don't think I have any psychiatric problem.  Patient endorse that there is nothing wrong with her.  She minimizes the circumstances that led her to come emergency room.  Patient remains very labile, easily irritable, seen giggling and laughing to herself.  She is refusing to cooperate to provide more information.  We still waiting for collateral information from her son.  Past Medical History:  Past Medical History  Diagnosis Date  . Bipolar 1 disorder    History reviewed. No pertinent past surgical history. Family History: No family history on file. Social History:  History  Alcohol Use: Not on file     History  Drug Use Not on file    Social History   Social History  . Marital Status: Single    Spouse  Name: N/A  . Number of Children: N/A  . Years of Education: N/A   Social History Main Topics  . Smoking status: None  . Smokeless tobacco: None  . Alcohol Use: None  . Drug Use: None  . Sexual Activity: Not Asked   Other Topics Concern  . None   Social History Narrative  . None   Additional Social History:    History of alcohol / drug use?:  (Unable to assess )                     Allergies:   Allergies  Allergen Reactions  . Penicillins   . Shellfish Allergy     Labs:  No results found for this or any previous visit (from the past 48 hour(s)).  Vitals: Blood pressure 139/86, pulse 73, temperature 98 F (36.7 C), temperature source Oral, resp. rate 17, SpO2 100 %.  Risk to Self: Suicidal Ideation: No Suicidal Intent:  (Unable to assess ) Is patient at risk for suicide?:  (Unable to assess ) Suicidal Plan?:  (Unable to assess ) Access to Means:  (Unable to assess ) What has been your use of drugs/alcohol within the last 12 months?:  (Unable to assess ) How many times?:  (Unable to assess ) Other Self Harm Risks:  (Unable to assess ) Triggers for Past Attempts:  (Unable to assess ) Intentional Self Injurious Behavior:  (Unable to assess ) Risk to Others: Homicidal Ideation:  (Unable to assess ) Thoughts of Harm to Others:  (Unable  to assess ) Current Homicidal Intent:  (Unable to assess ) Current Homicidal Plan:  (Unable to assess ) Access to Homicidal Means:  (Unable to assess ) Identified Victim:  (Unable to assess ) History of harm to others?:  (Unable to assess ) Assessment of Violence: None Noted Violent Behavior Description: No violent behaviors observed at this time. Pt is drowsy.  Does patient have access to weapons?:  (Unable to assess ) Criminal Charges Pending?:  (Unable to assess) Describe Pending Criminal Charges:  (Unable to assess ) Does patient have a court date:  (Unknown ) Prior Inpatient Therapy: Prior Inpatient Therapy:  (Unable to  assess ) Prior Therapy Dates:  (Unable to assess ) Prior Therapy Facilty/Provider(s):  (Unable to assess ) Reason for Treatment:  (Unable to assess ) Prior Outpatient Therapy: Prior Outpatient Therapy:  (Unable to assess ) Prior Therapy Dates:  (Unable to assess ) Prior Therapy Facilty/Provider(s):  (Unable to assess) Reason for Treatment:  (Unable to assess ) Does patient have an ACCT team?: Unknown Does patient have Intensive In-House Services?  : No Does patient have [facility] services? : Unknown Does patient have P4CC services?: Unknown  Current Facility-Administered Medications  Medication Dose Route Frequency Provider Last Rate Last Dose  . alum & mag hydroxide-simeth (MAALOX/MYLANTA) 200-200-20 MG/5ML suspension 30 mL  30 mL Oral PRN April Palumbo, MD      . benztropine (COGENTIN) tablet 0.5 mg  0.5 mg Oral BID Markita Stcharles   0.5 mg at 10/21/14 1059  . LORazepam (ATIVAN) tablet 1 mg  1 mg Oral Q8H PRN April Palumbo, MD      . OLANZapine zydis (ZYPREXA) disintegrating tablet 10 mg  10 mg Oral Q8H PRN Pranish Akhavan      . ondansetron [facility]) tablet 4 mg  4 mg Oral Q8H PRN April Palumbo, MD      . risperiDONE (RISPERDAL) tablet 0.5 mg  0.5 mg Oral BID Rondale Nies   0.5 mg at 10/21/14 1059  . ziprasidone (GEODON) injection 20 mg  20 mg Intramuscular Once Lorre Nick, MD   Stopped at 10/19/14 2150   No current outpatient prescriptions on file.    Musculoskeletal: Strength & Muscle Tone: decreased Gait & Station: normal Patient leans: N/A  Psychiatric Specialty Exam: Physical Exam  Review of Systems  Psychiatric/Behavioral: Positive for depression and hallucinations (paranoid ideation). The patient is nervous/anxious and has insomnia.   All other systems reviewed and are negative.   Blood pressure 139/86, pulse 73, temperature 98 F (36.7 C), temperature source Oral, resp. rate 17, SpO2 100 %.There is no height or weight on file to calculate BMI.  General Appearance:  Bizarre, Disheveled and Guarded  Eye Contact::  Fair  Speech:  Clear and Coherent and Normal Rate  Volume:  Increased  Mood:  Irritable  Affect:  Inappropriate and Labile  Thought Process:  Circumstantial, Disorganized, Irrelevant, Loose and Tangential  Orientation:  Negative  Thought Content:  Didn't speak much. Vague and guarded with responses.  Suicidal Thoughts:  No  Homicidal Thoughts:  Denies  Memory:  Immediate;   Poor Recent;   Poor Remote;   Poor  Judgement:  Impaired  Insight:  Lacking  Psychomotor Activity:  Decreased and Restlessness  Concentration:  Poor  Recall:  Poor  Fund of Knowledge:Poor  Language: Poor  Akathisia:  No  Handed:  Right  AIMS (if indicated):     Assets:  Housing  ADL's:  Impaired  Cognition: Impaired,  Moderate  Sleep:  Medical Decision Making: Review of Psycho-Social Stressors (1), Review or order clinical lab tests (1), Decision to obtain old records (1), Review and summation of old records (2) and Review of Medication Regimen & Side Effects (2)  Treatment Plan Summary: Review blood work results.  Patient remains very psychotic, paranoid and labile.  Still awaiting collateral information from her son.  Patient requires inpatient psychiatric treatment when she is medically cleared.  Plan:  Recommend psychiatric Inpatient admission when medically cleared.   Disposition:  -Inpatient psychiatric services for stabilization and treatment for the psychosis.  Beau Fanny, FNP-BC 10/21/2014 12:25 PM Patient seen face-to-face for psychiatric evaluation, chart reviewed and case discussed with the physician extender and developed treatment plan. Reviewed the information documented and agree with the treatment plan. Thedore Mins, MD

## 2014-10-21 NOTE — ED Notes (Signed)
Pt. Noted sleeping in room. No complaints or concerns voiced. No distress or abnormal behavior noted. Will continue to monitor with security cameras. Q 15 minute rounds continue. 

## 2014-10-21 NOTE — BH Assessment (Signed)
[facility] Assessment Progress Note  The following facilities have been contacted to seek placement for this pt, with results as noted:  Beds available, information sent, decision pending:  [facility]  At capacity:  [facility], [place] Triage Specialist [phone]

## 2014-10-21 NOTE — ED Notes (Signed)
Pt sleeping at present, no distress noted, monitoring for safety, Q15 min checks in effect. 

## 2014-10-21 NOTE — [facility] Counselor (Signed)
[facility] Assessment Progress Note  Pt referral information faxed to [place], [facility], & [facility], as they indicated they had geriatric bed availability.   The following facilities were at capacity: [facility], [facility], [facility], [facility], and [facility].    Johny Shock. Ladona Ridgel, MS, NCC, LPCA Counselor

## 2014-10-21 NOTE — ED Notes (Signed)
Pt is up in unit acting bizarre.  When asked if she needs anything she walks away.  She occasional gets very demanding.  She denies SI HI and AVH.  She is refusing meds and vitals.  15 minute checks are in place.

## 2014-10-21 NOTE — ED Notes (Signed)
Patient refused vitals. Patient stated "No more vitals today" attending RN notified.

## 2014-10-21 NOTE — ED Notes (Signed)
Attempted to obtain EKG per MD order.  Pt refused, telling us to leave her room.

## 2014-10-22 NOTE — ED Notes (Signed)
Patient refused vitals RN Aundra Millet notified

## 2014-10-22 NOTE — ED Notes (Signed)
Attempted to explain to pt. That she needs to comply with medical treatment to get better.  She adamantly refused any meds or procedures and stated that the was supposed to have left on  August 21.  She denies SI, HI, and AVH.  15 minute checks in place.

## 2014-10-22 NOTE — ED Notes (Signed)
Pt refused vital signs. RN notified.  

## 2014-10-22 NOTE — BH Assessment (Signed)
[facility] Assessment Progress Note  The following facilities have been contacted to seek placement for this pt, with results as noted:  Beds available, information sent, decision pending:  [facility]  At capacity:  [facility]  Declined:  [facility] (due to pt's refusal to cooperate with chest x-ray)   Doylene Canning, MA Triage Specialist [phone]

## 2014-10-22 NOTE — ED Notes (Signed)
Jodi Evans is refusing her medications. States she does not take "sleep medications". Explained to her the purpose of her medications and she adamantly states "NO"! She is in her bed with the covers still pulled up over her head.

## 2014-10-22 NOTE — Progress Notes (Signed)
Pt declined: [facility] due to pt refusing chest xray.

## 2014-10-22 NOTE — ED Notes (Signed)
Pt refused vitals 

## 2014-10-22 NOTE — BH Assessment (Signed)
Writer reassessed pt. Pt refused to speak with Clinical research associate. Pt stood facing wall with back to Clinical research associate. Pt then walked out of room. Pt came back in room and shut room door in writer's face. Dr Jannifer Franklin & Dahlia Byes NP continue to recommend inpatient geropsych treatment for pt.   Evette Cristal, [place] Therapeutic Triage Specialist

## 2014-10-23 MED ORDER — AMOXICILLIN 500 MG PO CAPS: 500.0000 mg | ORAL_CAPSULE | Freq: Three times a day (TID) | ORAL | Status: DC

## 2014-10-23 MED ORDER — MENTHOL 3 MG MT LOZG
1.0000 | LOZENGE | OROMUCOSAL | Status: DC | PRN
  Filled 2014-10-23: qty 9

## 2014-10-23 NOTE — ED Notes (Signed)
Jodi Evans is more communicative tonight. Still suspicious with flat affect, glaring stare and bizarre behavior. Attempting to ignore this RN's personal space during shift assessment. She was noted to initially have covers drawn over her head and then rose from bed to stand up directly in front of this RN. She was asked to have a seat and she obliged. I asked her how her day went today. She states "fine". I asked her what was fine about it and she states "my privacy". When asked if she was having thoughts of harming herself she states " No! I love myself too much".  She denies HI/AVH, depression and anxiety. She has no pain at this time.

## 2014-10-23 NOTE — BH Assessment (Signed)
Reassessment 10/23/2014:  Writer attempted to assessment patient. She was uncooperative and refused to answer any questions. Patient stating she is ready to discharge. She asked this Clinical research associate to leave her room. Dr Jannifer Franklin & Dahlia Byes NP continue to recommend inpatient geropsych treatment for pt.

## 2014-10-23 NOTE — ED Notes (Signed)
Pt continues to refuse all procedures and meds.  She is alert and oriented.  Denies SI, HI, AVH.  15 minute checks in place.

## 2014-10-23 NOTE — Progress Notes (Signed)
CM spoke with pt who confirms uninsured [employer] resident with no pcp.  CM discussed and provided written information for uninsured accepting pcps, discussed the importance of pcp vs EDP services for f/u care, www.needymeds.org, www.goodrx.com, discounted pharmacies and other [employer] such as [facility] , [employer], affordable care act, financial assistance, uninsured dental services, [place] med assist, DSS and  health department  Reviewed resources for [employer] uninsured accepting pcps like [facility], family medicine at [facility], [facility], [facility], local urgent care centers, [facility], [facility] family practice, general medical clinics, family services of the [place], [facility] urgent care plus others, medication resources, [facility] out patient pharmacies and housing Pt voiced understanding and appreciation of resources provided   Provided [facility] contact information Pt at nursing station with phone in her hand entering in numbers Pt allowed CM to speak with her but stated " I don't want to read" and agreed to have CM leave resources in her room (37)

## 2014-10-23 NOTE — ED Notes (Signed)
Vital signs refused.  

## 2014-10-24 NOTE — BHH Counselor (Signed)
Pending review for possible placement with [facility].  

## 2014-10-24 NOTE — ED Notes (Signed)
Pt ambulatory w/o difficulty w/ sheriff to [facility].  Belongings given to sheriff. IVC papers, transfer report, MAR report, EKG, assessment note, EMTELA sent with sheriff.

## 2014-10-24 NOTE — ED Notes (Signed)
Attempted to call report to [facility]-they will not accept report until transportation is here.  Will call back when the sheriff arrives.

## 2014-10-24 NOTE — Consult Note (Signed)
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 Psychosis [F29] 10/19/2014    Priority: High  . Paranoid schizophrenia [F20.0]     Priority: High  . Psychoses [F29]     Total Time spent with patient: 15 minutes  Subjective:  "Get out of my room, I am doing fine thank you.''  Objective: Patient seen, interviewed and chart reviewed. She remains uncooperative, angry, labile, very guarded and  Paranoid. Patient is self isolated and withdrawn into her room with no interaction with peers and staff. She has refused all the treatment recommended for her even though she is paranoid, very labile, observed to be giggling, laughing and talking to herself as if responding to internal stimuli. Patient was involuntarily committed by her son who states that patient receives care from [facility]. Patient will benefit from inpatient admission and medication management for stabilization. Past Medical History:  Past Medical History  Diagnosis Date  . Bipolar 1 disorder    History reviewed. No pertinent past surgical history. Family History: No family history on file. Social History:  History  Alcohol Use: Not on file     History  Drug Use Not on file    Social History   Social History  . Marital Status: Single    Spouse Name: N/A  . Number of Children: N/A  . Years of Education: N/A   Social History Main Topics  . Smoking status: None  . Smokeless tobacco: None  . Alcohol Use: None  . Drug Use: None  . Sexual Activity: Not Asked   Other Topics Concern  . None   Social History Narrative  . None   Additional Social History:    History of alcohol / drug use?:  (Unable to assess )                     Allergies:   Allergies  Allergen Reactions  . Penicillins   . Shellfish Allergy      Labs:  No results found for this or any previous visit (from the past 48 hour(s)).  Vitals: Blood pressure 139/86, pulse 73, temperature 98 F (36.7 C), temperature source Oral, resp. rate 17, SpO2 100 %.  Risk to Self: Suicidal Ideation: No Suicidal Intent:  (Unable to assess ) Is patient at risk for suicide?:  (Unable to assess ) Suicidal Plan?:  (Unable to assess ) Access to Means:  (Unable to assess ) What has been your use of drugs/alcohol within the last 12 months?:  (Unable to assess ) How many times?:  (Unable to assess ) Other Self Harm Risks:  (Unable to assess ) Triggers for Past Attempts:  (Unable to assess ) Intentional Self Injurious Behavior:  (Unable to assess ) Risk to Others: Homicidal Ideation:  (Unable to assess ) Thoughts of Harm to Others:  (Unable to assess ) Current Homicidal Intent:  (Unable to assess ) Current Homicidal Plan:  (Unable to assess ) Access to Homicidal Means:  (Unable to assess ) Identified Victim:  (Unable to assess ) History of harm to others?:  (Unable to assess ) Assessment of Violence: None Noted Violent Behavior Description: No violent behaviors observed at this time. Pt is drowsy.  Does patient have access to weapons?:  (Unable to assess ) Criminal Charges Pending?:  (Unable to assess) Describe Pending  Criminal Charges:  (Unable to assess ) Does patient have a court date:  (Unknown ) Prior Inpatient Therapy: Prior Inpatient Therapy:  (Unable to assess ) Prior Therapy Dates:  (Unable to assess ) Prior Therapy Facilty/Provider(s):  (Unable to assess ) Reason for Treatment:  (Unable to assess ) Prior Outpatient Therapy: Prior Outpatient Therapy:  (Unable to assess ) Prior Therapy Dates:  (Unable to assess ) Prior Therapy Facilty/Provider(s):  (Unable to assess) Reason for Treatment:  (Unable to assess ) Does patient have an ACCT team?: Unknown Does patient have Intensive In-House Services?  : No Does patient have [facility]  services? : Unknown Does patient have [facility] services?: Unknown  Current Facility-Administered Medications  Medication Dose Route Frequency Provider Last Rate Last Dose  . alum & mag hydroxide-simeth (MAALOX/MYLANTA) 200-200-20 MG/5ML suspension 30 mL  30 mL Oral PRN April Palumbo, MD      . benztropine (COGENTIN) tablet 0.5 mg  0.5 mg Oral BID Koreen Lizaola   0.5 mg at 10/21/14 1059  . LORazepam (ATIVAN) tablet 1 mg  1 mg Oral Q8H PRN April Palumbo, MD      . OLANZapine zydis (ZYPREXA) disintegrating tablet 10 mg  10 mg Oral Q8H PRN Carra Brindley      . ondansetron [facility]) tablet 4 mg  4 mg Oral Q8H PRN April Palumbo, MD      . risperiDONE (RISPERDAL) tablet 0.5 mg  0.5 mg Oral BID Alieah Brinton   0.5 mg at 10/21/14 1059  . ziprasidone (GEODON) injection 20 mg  20 mg Intramuscular Once Lorre Nick, MD   Stopped at 10/19/14 2150   No current outpatient prescriptions on file.    Musculoskeletal: Strength & Muscle Tone: decreased Gait & Station: normal Patient leans: N/A  Psychiatric Specialty Exam: Physical Exam  Review of Systems  Psychiatric/Behavioral: Positive for depression and hallucinations (paranoid ideation). The patient is nervous/anxious and has insomnia.   All other systems reviewed and are negative.   Blood pressure 139/86, pulse 73, temperature 98 F (36.7 C), temperature source Oral, resp. rate 17, SpO2 100 %.There is no height or weight on file to calculate BMI.  General Appearance: Bizarre, Disheveled and Guarded  Eye Contact::  Fair  Speech:  Clear and Coherent and Normal Rate  Volume:  Increased  Mood:  Irritable  Affect:  Inappropriate and Labile  Thought Process:  Circumstantial, Disorganized, Irrelevant, Loose and Tangential  Orientation:  Full (Time, Place, and Person)  Thought Content:  talking to herself, appears to be responding to internal stimuli  Suicidal Thoughts:  No  Homicidal Thoughts:  Denies  Memory:  Immediate;   Poor Recent;    Poor Remote;   Poor  Judgement:  Impaired  Insight:  Lacking  Psychomotor Activity:  Decreased and Restlessness  Concentration:  Poor  Recall:  Poor  Fund of Knowledge:Poor  Language: Poor  Akathisia:  No  Handed:  Right  AIMS (if indicated):     Assets:  Housing  ADL's:  Impaired  Cognition: Impaired,  Moderate  Sleep:   poor   Medical Decision Making: Review of Psycho-Social Stressors (1), Review or order clinical lab tests (1), Decision to obtain old records (1), Review and summation of old records (2) and Review of Medication Regimen & Side Effects (2)  Treatment Plan Summary: Review blood work results. Patient requires inpatient psychiatric treatment when she is medically cleared.  Plan: 1.  Recommend psychiatric Inpatient admission when medically cleared.  2. Patient remains very psychotic, paranoid, delusional, Labile  and I recommend forced medication for stabilization.   Thedore Mins, MD 10/24/2014 11:27 AM

## 2014-10-24 NOTE — ED Notes (Signed)
Pt declined to allow VSS to be taken dinamap or manualy

## 2014-10-24 NOTE — ED Notes (Signed)
Sheriff contacted for transport 

## 2014-10-24 NOTE — ED Notes (Signed)
Up to the bathroom 

## 2014-10-24 NOTE — ED Notes (Signed)
sherrif will be here approx 1800 to transport

## 2014-10-24 NOTE — ED Notes (Signed)
Attempted to call report to [facility]-they requested that report be called immediately prior to transport.  Will call back later.

## 2014-10-24 NOTE — ED Notes (Signed)
Pt laying down facing the wall, did  not respond when asked about taking medications

## 2014-10-24 NOTE — ED Notes (Signed)
Pt refused vital signs. RN notified.  

## 2014-10-24 NOTE — BH Assessment (Signed)
[facility]HH Assessment Progress Note  Per Thedore Mins, MD, this pt continues to require psychiatric hospitalization.  At 13:45 Thayer Ohm called from [facility].  Pt has been accepted to their facility by Dr Tonita Phoenix.  Please call report to [phone].  Pt is under IVC and is to be transported via [facility].  Pt's nurse, Wille Celeste, has been notified.  Doylene Canning, MA Triage Specialist [phone]

## 2015-01-15 ENCOUNTER — Emergency Department (HOSPITAL_COMMUNITY)
Admission: EM | Admit: 2015-01-15 | Discharge: 2015-01-16 | Disposition: A | Discharge status: Home / Self Care | Payer: Medicare Other | Attending: Emergency Medicine | Admitting: Emergency Medicine

## 2015-01-15 ENCOUNTER — Encounter (HOSPITAL_COMMUNITY): Payer: Self-pay | Admitting: Emergency Medicine

## 2015-01-15 DIAGNOSIS — I10 Essential (primary) hypertension: Secondary | ICD-10-CM | POA: Insufficient documentation

## 2015-01-15 DIAGNOSIS — F419 Anxiety disorder, unspecified: Secondary | ICD-10-CM | POA: Diagnosis not present

## 2015-01-15 DIAGNOSIS — R000 Tachycardia, unspecified: Secondary | ICD-10-CM | POA: Diagnosis not present

## 2015-01-15 DIAGNOSIS — Z008 Encounter for other general examination: Secondary | ICD-10-CM | POA: Diagnosis present

## 2015-01-15 HISTORY — DX: Essential (primary) hypertension: I10

## 2015-01-15 HISTORY — DX: Paranoid schizophrenia: F20.0

## 2015-01-15 NOTE — ED Notes (Signed)
Pt changed into paper scrubs.  Security wanding pt.

## 2015-01-15 NOTE — ED Notes (Signed)
Difficulty getting pt to change into paper scrubs.  Security at triage to wand pt and staffing [facility].  LPS officer is trying to get pt to change into paper scrubs.

## 2015-01-15 NOTE — ED Notes (Addendum)
Pt to ED in custody with IVC papers.  Pt with [place] Police [place].  Son brought pt to crisis center this morning for bizarre and paranoid thoughts.  Pt has history of multiple psychiatric hospitalizations for bipolar and paranoid schizophrenia.   Poor compliance with medications.  Pt is to be medically cleared and then [facility]  Pt answering questions but then speaking in tongue.

## 2015-01-16 LAB — SALICYLATE LEVEL

## 2015-01-16 LAB — RAPID URINE DRUG SCREEN, HOSP PERFORMED
Amphetamines: NOT DETECTED
BARBITURATES: NOT DETECTED
Benzodiazepines: NOT DETECTED
COCAINE: NOT DETECTED
OPIATES: NOT DETECTED
TETRAHYDROCANNABINOL: NOT DETECTED

## 2015-01-16 LAB — COMPREHENSIVE METABOLIC PANEL
ALK PHOS: 92 U/L (ref 38–126)
ALT: 46 U/L (ref 14–54)
ANION GAP: 11 (ref 5–15)
AST: 89 U/L — ABNORMAL HIGH (ref 15–41)
Albumin: 4.1 g/dL (ref 3.5–5.0)
BILIRUBIN TOTAL: 1.1 mg/dL (ref 0.3–1.2)
BUN: 16 mg/dL (ref 6–20)
CALCIUM: 9.6 mg/dL (ref 8.9–10.3)
CO2: 21 mmol/L — ABNORMAL LOW (ref 22–32)
CREATININE: 1.17 mg/dL — AB (ref 0.44–1.00)
Chloride: 108 mmol/L (ref 101–111)
GFR, EST AFRICAN AMERICAN: 56 mL/min — AB (ref >60)
GFR, EST NON AFRICAN AMERICAN: 48 mL/min — AB (ref >60)
Glucose, Bld: 125 mg/dL — ABNORMAL HIGH (ref 65–99)
Potassium: 3.4 mmol/L — ABNORMAL LOW (ref 3.5–5.1)
SODIUM: 140 mmol/L (ref 135–145)
TOTAL PROTEIN: 8.1 g/dL (ref 6.5–8.1)

## 2015-01-16 LAB — CBC
HCT: 36.2 % (ref 36.0–46.0)
Hemoglobin: 12.5 g/dL (ref 12.0–15.0)
MCH: 26.8 pg (ref 26.0–34.0)
MCHC: 34.5 g/dL (ref 30.0–36.0)
MCV: 77.7 fL — ABNORMAL LOW (ref 78.0–100.0)
Platelets: 164 10*3/uL (ref 150–400)
RBC: 4.66 MIL/uL (ref 3.87–5.11)
RDW: 14.6 % (ref 11.5–15.5)
WBC: 8 10*3/uL (ref 4.0–10.5)

## 2015-01-16 LAB — ETHANOL

## 2015-01-16 LAB — ACETAMINOPHEN LEVEL

## 2015-01-16 MED ORDER — AMLODIPINE BESYLATE 5 MG PO TABS: 5.0000 mg | ORAL_TABLET | Freq: Every day | ORAL | Status: DC

## 2015-01-16 NOTE — Discharge Instructions (Signed)
Take Norvasc as prescribed. Have your blood pressure rechecked in 1 week. Discontinue if you feel lightheaded/dizzy or lose consciousness.  Hypertension Hypertension, commonly called high blood pressure, is when the force of blood pumping through your arteries is too strong. Your arteries are the blood vessels that carry blood from your heart throughout your body. A blood pressure reading consists of a higher number over a lower number, such as 110/72. The higher number (systolic) is the pressure inside your arteries when your heart pumps. The lower number (diastolic) is the pressure inside your arteries when your heart relaxes. Ideally you want your blood pressure below 120/80. Hypertension forces your heart to work harder to pump blood. Your arteries may become narrow or stiff. Having untreated or uncontrolled hypertension can cause heart attack, stroke, kidney disease, and other problems. RISK FACTORS Some risk factors for high blood pressure are controllable. Others are not.  Risk factors you cannot control include:   Race. You may be at higher risk if you are African American.  Age. Risk increases with age.  Gender. Men are at higher risk than women before age 32 years. After age 41, women are at higher risk than men. Risk factors you can control include:  Not getting enough exercise or physical activity.  Being overweight.  Getting too much fat, sugar, calories, or salt in your diet.  Drinking too much alcohol. SIGNS AND SYMPTOMS Hypertension does not usually cause signs or symptoms. Extremely high blood pressure (hypertensive crisis) may cause headache, anxiety, shortness of breath, and nosebleed. DIAGNOSIS To check if you have hypertension, your health care provider will measure your blood pressure while you are seated, with your arm held at the level of your heart. It should be measured at least twice using the same arm. Certain conditions can cause a difference in blood pressure  between your right and left arms. A blood pressure reading that is higher than normal on one occasion does not mean that you need treatment. If it is not clear whether you have high blood pressure, you may be asked to return on a different day to have your blood pressure checked again. Or, you may be asked to monitor your blood pressure at home for 1 or more weeks. TREATMENT Treating high blood pressure includes making lifestyle changes and possibly taking medicine. Living a healthy lifestyle can help lower high blood pressure. You may need to change some of your habits. Lifestyle changes may include:  Following the DASH diet. This diet is high in fruits, vegetables, and whole grains. It is low in salt, red meat, and added sugars.  Keep your sodium intake below 2,300 mg per day.  Getting at least 30-45 minutes of aerobic exercise at least 4 times per week.  Losing weight if necessary.  Not smoking.  Limiting alcoholic beverages.  Learning ways to reduce stress. Your health care provider may prescribe medicine if lifestyle changes are not enough to get your blood pressure under control, and if one of the following is true:  You are 64-37 years of age and your systolic blood pressure is above 140.  You are 57 years of age or older, and your systolic blood pressure is above 150.  Your diastolic blood pressure is above 90.  You have diabetes, and your systolic blood pressure is over 140 or your diastolic blood pressure is over 90.  You have kidney disease and your blood pressure is above 140/90.  You have heart disease and your blood pressure is above  140/90. Your personal target blood pressure may vary depending on your medical conditions, your age, and other factors. HOME CARE INSTRUCTIONS  Have your blood pressure rechecked as directed by your health care provider.   Take medicines only as directed by your health care provider. Follow the directions carefully. Blood pressure  medicines must be taken as prescribed. The medicine does not work as well when you skip doses. Skipping doses also puts you at risk for problems.  Do not smoke.   Monitor your blood pressure at home as directed by your health care provider. SEEK MEDICAL CARE IF:   You think you are having a reaction to medicines taken.  You have recurrent headaches or feel dizzy.  You have swelling in your ankles.  You have trouble with your vision. SEEK IMMEDIATE MEDICAL CARE IF:  You develop a severe headache or confusion.  You have unusual weakness, numbness, or feel faint.  You have severe chest or abdominal pain.  You vomit repeatedly.  You have trouble breathing. MAKE SURE YOU:   Understand these instructions.  Will watch your condition.  Will get help right away if you are not doing well or get worse.   This information is not intended to replace advice given to you by your health care provider. Make sure you discuss any questions you have with your health care provider.   Document Released: 02/15/2005 Document Revised: 07/02/2014 Document Reviewed: 12/08/2012 Elsevier Interactive Patient Education [employer]2016 Elsevier Inc.

## 2015-01-16 NOTE — ED Provider Notes (Signed)
CSN: [id number]     Arrival date & time 01/15/15  2228 History   First MD Initiated Contact with Patient 01/16/15 0018     Chief Complaint  Patient presents with  . IVC   . Medical Clearance  . Paranoid     (Consider location/radiation/quality/duration/timing/severity/associated sxs/prior Treatment) HPI Comments: Patient is a 64 year old female with a history of bipolar 1 disorder, paranoid schizophrenia, and hypertension. She presents to the emergency department for medical clearance. IVC papers taken out by [facility]. Vesta Mixer was concerned as the patient was found to have high blood pressure. Patient is in no acute distress. She has no complaints of pain including no complaints of chest pain.   The history is provided by the patient and medical records. No language interpreter was used.    Past Medical History  Diagnosis Date  . Bipolar 1 disorder (HCC)   . Paranoid schizophrenia (HCC)   . Hypertension    History reviewed. No pertinent past surgical history. No family history on file. Social History  Substance Use Topics  . Smoking status: Former Games developer  . Smokeless tobacco: None  . Alcohol Use: No   OB History    No data available      Review of Systems  Unable to perform ROS: Psychiatric disorder  Cardiovascular: Negative for chest pain.  Psychiatric/Behavioral: Positive for behavioral problems.    Allergies  Penicillins and Shellfish allergy  Home Medications   Prior to Admission medications   Medication Sig Start Date End Date Taking? Authorizing Provider  amLODipine (NORVASC) 5 MG tablet Take 1 tablet (5 mg total) by mouth daily. 01/16/15   Antony Madura, PA-C   BP 157/93 mmHg  Pulse 117  Temp(Src) 98.3 F (36.8 C) (Oral)  Resp 22  SpO2 98%   Physical Exam  Constitutional: She is oriented to person, place, and time. She appears well-developed and well-nourished. No distress.  Nontoxic/nonseptic appearing  HENT:  Head: Normocephalic and atraumatic.   Eyes: Conjunctivae and EOM are normal. No scleral icterus.  Neck: Normal range of motion.  Cardiovascular: Regular rhythm and intact distal pulses.   Mild tachycardia  Pulmonary/Chest: Effort normal. No respiratory distress. She has no wheezes. She has no rales.  Respirations even and unlabored.  Musculoskeletal: Normal range of motion.  Neurological: She is alert and oriented to person, place, and time. She exhibits normal muscle tone. Coordination normal.  Skin: Skin is warm and dry. No rash noted. She is not diaphoretic. No erythema. No pallor.  Psychiatric: Her behavior is normal. Her mood appears anxious. Her speech is rapid and/or pressured and tangential.  Nursing note and vitals reviewed.   ED Course  Procedures (including critical care time) Labs Review Labs Reviewed  COMPREHENSIVE METABOLIC PANEL - Abnormal; Notable for the following:    Potassium 3.4 (*)    CO2 21 (*)    Glucose, Bld 125 (*)    Creatinine, Ser 1.17 (*)    AST 89 (*)    GFR calc non Af Amer 48 (*)    GFR calc Af Amer 56 (*)    All other components within normal limits  ACETAMINOPHEN LEVEL - Abnormal; Notable for the following:    Acetaminophen (Tylenol), Serum <10 (*)    All other components within normal limits  CBC - Abnormal; Notable for the following:    MCV 77.7 (*)    All other components within normal limits  ETHANOL  SALICYLATE LEVEL  URINE RAPID DRUG SCREEN, HOSP PERFORMED    Imaging  Review No results found.   I have personally reviewed and evaluated these images and lab results as part of my medical decision-making.   EKG Interpretation   Date/Time:  Wednesday January 15 2015 23:09:37 EST Ventricular Rate:  116 PR Interval:  162 QRS Duration: 80 QT Interval:  328 QTC Calculation: 455 R Axis:   -67 Text Interpretation:  Sinus tachycardia Left axis deviation Possible  Anterior infarct , age undetermined Abnormal ECG worsening t wave  progression Confirmed by Erroll Lunani, Adeleke  Ayokunle [phone]([id number]) on 01/16/2015  1:37:54 AM      MDM   Final diagnoses:  Essential hypertension    [facility]. [facility] concerned about patient's blood pressure. Patient with BP of 195/97 mmHg and HR of 140 during ED psych evaluation in August 2016. Her BP and vitals appear c/w this today. Patient with no c/o chest pain. EKG is nonischemic. Labs reviewed and patient has been medically cleared. Will start on low dose Norvasc given slightly elevated creatinine function. Patient given instructions to f/u with her BP in 1 week for BP recheck.   Filed Vitals:   01/15/15 2301 01/16/15 0201  BP: 157/93 147/96  Pulse: 117 110  Temp: 98.3 F (36.8 C) 97.9 F (36.6 C)  TempSrc: Oral   Resp: 22 20  SpO2: 98% 100%     Antony MaduraKelly Windie Marasco, PA-C 01/16/15 0401  Tomasita CrumbleAdeleke Oni, MD 01/16/15 [id number]

## 2015-03-07 ENCOUNTER — Emergency Department (HOSPITAL_COMMUNITY)
Admission: EM | Admit: 2015-03-07 | Discharge: 2015-03-11 | Payer: Medicare Other | Attending: Emergency Medicine | Admitting: Emergency Medicine

## 2015-03-07 ENCOUNTER — Encounter (HOSPITAL_COMMUNITY): Payer: Self-pay

## 2015-03-07 DIAGNOSIS — Z880 Allergy status to penicillin: Secondary | ICD-10-CM | POA: Diagnosis not present

## 2015-03-07 DIAGNOSIS — F319 Bipolar disorder, unspecified: Secondary | ICD-10-CM | POA: Insufficient documentation

## 2015-03-07 DIAGNOSIS — I10 Essential (primary) hypertension: Secondary | ICD-10-CM | POA: Diagnosis not present

## 2015-03-07 DIAGNOSIS — Z79899 Other long term (current) drug therapy: Secondary | ICD-10-CM | POA: Insufficient documentation

## 2015-03-07 DIAGNOSIS — F22 Delusional disorders: Secondary | ICD-10-CM | POA: Insufficient documentation

## 2015-03-07 DIAGNOSIS — Z87891 Personal history of nicotine dependence: Secondary | ICD-10-CM | POA: Insufficient documentation

## 2015-03-07 DIAGNOSIS — F29 Unspecified psychosis not due to a substance or known physiological condition: Secondary | ICD-10-CM

## 2015-03-07 DIAGNOSIS — Z008 Encounter for other general examination: Secondary | ICD-10-CM | POA: Diagnosis present

## 2015-03-07 DIAGNOSIS — F250 Schizoaffective disorder, bipolar type: Secondary | ICD-10-CM | POA: Diagnosis present

## 2015-03-07 NOTE — ED Provider Notes (Signed)
CSN: [id number]     Arrival date & time 03/07/15  2307 History   First MD Initiated Contact with Patient 03/07/15 2330     Chief Complaint  Patient presents with  . Medical Clearance     (Consider location/radiation/quality/duration/timing/severity/associated sxs/prior Treatment) HPI Comments: Dropped off by her son" because he is tired of me smoking in his house"  The neighbors" tell him that when he is at work I smoke and annoy them"  I just want to go to a motel I have a credit card but no ID so that may be a problem  Denies SI/HI   The history is provided by the patient.    Past Medical History  Diagnosis Date  . Bipolar 1 disorder (HCC)   . Paranoid schizophrenia (HCC)   . Hypertension    History reviewed. No pertinent past surgical history. No family history on file. Social History  Substance Use Topics  . Smoking status: Former Games developer  . Smokeless tobacco: None  . Alcohol Use: No   OB History    No data available     Review of Systems  Constitutional: Negative for fever.  Respiratory: Negative for cough.   Cardiovascular: Negative for chest pain.  Gastrointestinal: Negative for abdominal pain.  Neurological: Negative for dizziness and headaches.  Psychiatric/Behavioral: Negative for suicidal ideas, sleep disturbance and agitation. The patient is not nervous/anxious.   All other systems reviewed and are negative.     Allergies  Penicillins and Shellfish allergy  Home Medications   Prior to Admission medications   Medication Sig Start Date End Date Taking? Authorizing Provider  amLODipine (NORVASC) 5 MG tablet Take 1 tablet (5 mg total) by mouth daily. 03/17/15   Shari Prows, MD  benztropine (COGENTIN) 0.5 MG tablet Take 1 tablet (0.5 mg total) by mouth daily. 03/17/15   Shari Prows, MD  carbamazepine (TEGRETOL) 200 MG tablet Take 1 tablet (200 mg total) by mouth 2 (two) times daily at 8 am and 10 pm. 03/17/15   Jolanta B Pucilowska, MD   fluPHENAZine (PROLIXIN) 5 MG tablet Take 1 tablet (5 mg total) by mouth 2 (two) times daily. 03/17/15   Shari Prows, MD  fluPHENAZine decanoate (PROLIXIN) 25 MG/ML injection Inject 2 mLs (50 mg total) into the muscle every 14 (fourteen) days. 03/17/15   Shari Prows, MD  traZODone (DESYREL) 150 MG tablet Take 1 tablet (150 mg total) by mouth at bedtime. 03/17/15   Jolanta B Pucilowska, MD   BP 129/81 mmHg  Pulse 76  Temp(Src) 98 F (36.7 C) (Oral)  Resp 20  SpO2 98% Physical Exam  Constitutional: She is oriented to person, place, and time. She appears well-developed and well-nourished.  HENT:  Head: Normocephalic.  Eyes: Pupils are equal, round, and reactive to light.  Neck: Normal range of motion.  Cardiovascular: Normal rate.   Pulmonary/Chest: Effort normal.  Musculoskeletal: Normal range of motion.  Neurological: She is alert and oriented to person, place, and time.  Skin: Skin is warm.  Psychiatric: Her affect is blunt. Her speech is rapid and/or pressured. Thought content is paranoid. Cognition and memory are normal. She expresses inappropriate judgment. She expresses no homicidal and no suicidal ideation. She expresses no suicidal plans and no homicidal plans.  Nursing note and vitals reviewed.   ED Course  Procedures (including critical care time) Labs Review Labs Reviewed  CBC WITH DIFFERENTIAL/PLATELET - Abnormal; Notable for the following:    Hemoglobin 11.7 (*)  HCT 34.7 (*)    All other components within normal limits  URINALYSIS, ROUTINE W REFLEX MICROSCOPIC (NOT AT [facility]) - Abnormal; Notable for the following:    APPearance CLOUDY (*)    Leukocytes, UA MODERATE (*)    All other components within normal limits  URINE MICROSCOPIC-ADD ON - Abnormal; Notable for the following:    Squamous Epithelial / LPF 6-30 (*)    Bacteria, UA FEW (*)    All other components within normal limits  I-STAT CHEM 8, ED - Abnormal; Notable for the following:    BUN  35 (*)    Creatinine, Ser 1.10 (*)    All other components within normal limits  URINE RAPID DRUG SCREEN, HOSP PERFORMED  ETHANOL  I-STAT CHEM 8, ED    Imaging Review No results found. I have personally reviewed and evaluated these images and lab results as part of my medical decision-making.   EKG Interpretation None     using lab draw, but she will provide a urine sample.  She's been assessed by request from TTS.  She will be referred to in patient therapy Patient will be emergently.  IVC due to increasing agitation.   MDM   Final diagnoses:  Psychosis, unspecified psychosis type        Earley FavorGail Jenae Tomasello, NP 03/27/15 2004  Gilda Creasehristopher J Pollina, MD 03/27/15 [phone]

## 2015-03-07 NOTE — ED Notes (Signed)
Patient states that has been living with son.  Patient states "son dropped [facility] off at [facility] to [facility]".  Patient [facility] and is unclear of the details of the stay.  Patient wants to see a MD to know why she is here.  Patient denies pain.

## 2015-03-08 DIAGNOSIS — F250 Schizoaffective disorder, bipolar type: Secondary | ICD-10-CM | POA: Diagnosis present

## 2015-03-08 LAB — CBC WITH DIFFERENTIAL/PLATELET
Basophils Absolute: 0 10*3/uL (ref 0.0–0.1)
Basophils Relative: 0 %
Eosinophils Absolute: 0.1 10*3/uL (ref 0.0–0.7)
Eosinophils Relative: 2 %
HEMATOCRIT: 34.7 % — AB (ref 36.0–46.0)
HEMOGLOBIN: 11.7 g/dL — AB (ref 12.0–15.0)
LYMPHS ABS: 1.9 10*3/uL (ref 0.7–4.0)
Lymphocytes Relative: 35 %
MCH: 26.4 pg (ref 26.0–34.0)
MCHC: 33.7 g/dL (ref 30.0–36.0)
MCV: 78.2 fL (ref 78.0–100.0)
MONOS PCT: 6 %
Monocytes Absolute: 0.3 10*3/uL (ref 0.1–1.0)
NEUTROS ABS: 3.1 10*3/uL (ref 1.7–7.7)
NEUTROS PCT: 57 %
Platelets: 180 10*3/uL (ref 150–400)
RBC: 4.44 MIL/uL (ref 3.87–5.11)
RDW: 15.1 % (ref 11.5–15.5)
WBC: 5.4 10*3/uL (ref 4.0–10.5)

## 2015-03-08 LAB — I-STAT CHEM 8, ED
BUN: 35 mg/dL — AB (ref 6–20)
CALCIUM ION: 1.25 mmol/L (ref 1.13–1.30)
CHLORIDE: 109 mmol/L (ref 101–111)
Creatinine, Ser: 1.1 mg/dL — ABNORMAL HIGH (ref 0.44–1.00)
Glucose, Bld: 96 mg/dL (ref 65–99)
HCT: 36 % (ref 36.0–46.0)
Hemoglobin: 12.2 g/dL (ref 12.0–15.0)
Potassium: 3.8 mmol/L (ref 3.5–5.1)
SODIUM: 145 mmol/L (ref 135–145)
TCO2: 23 mmol/L (ref 0–100)

## 2015-03-08 LAB — RAPID URINE DRUG SCREEN, HOSP PERFORMED
Amphetamines: NOT DETECTED
BARBITURATES: NOT DETECTED
BENZODIAZEPINES: NOT DETECTED
COCAINE: NOT DETECTED
Opiates: NOT DETECTED
TETRAHYDROCANNABINOL: NOT DETECTED

## 2015-03-08 LAB — ETHANOL: Alcohol, Ethyl (B): <5 mg/dL (ref <5)

## 2015-03-08 MED ORDER — FLUPHENAZINE HCL 5 MG PO TABS
5.0000 mg | ORAL_TABLET | Freq: Two times a day (BID) | ORAL | Status: DC
  Administered 2015-03-08 – 2015-03-11 (×6): 5 mg via ORAL
  Filled 2015-03-08 (×8): qty 1

## 2015-03-08 MED ORDER — TRAZODONE HCL 50 MG PO TABS
50.0000 mg | ORAL_TABLET | Freq: Every day | ORAL | Status: DC
  Administered 2015-03-08: 50 mg via ORAL
  Filled 2015-03-08: qty 1

## 2015-03-08 MED ORDER — ZIPRASIDONE MESYLATE 20 MG IM SOLR
20.0000 mg | Freq: Once | INTRAMUSCULAR | Status: AC
  Administered 2015-03-08: 20 mg via INTRAMUSCULAR
  Filled 2015-03-08: qty 20

## 2015-03-08 MED ORDER — BENZTROPINE MESYLATE 1 MG PO TABS
0.5000 mg | ORAL_TABLET | Freq: Every day | ORAL | Status: DC
  Administered 2015-03-08: 0.5 mg via ORAL
  Filled 2015-03-08 (×2): qty 1

## 2015-03-08 MED ORDER — BENZTROPINE MESYLATE 1 MG PO TABS
1.0000 mg | ORAL_TABLET | Freq: Two times a day (BID) | ORAL | Status: DC
  Administered 2015-03-08 – 2015-03-11 (×6): 1 mg via ORAL
  Filled 2015-03-08 (×6): qty 1

## 2015-03-08 MED ORDER — LORAZEPAM 2 MG/ML IJ SOLN
1.0000 mg | Freq: Once | INTRAMUSCULAR | Status: AC
  Administered 2015-03-08: 1 mg via INTRAVENOUS
  Filled 2015-03-08: qty 1

## 2015-03-08 MED ORDER — RISPERIDONE 2 MG PO TABS
2.0000 mg | ORAL_TABLET | Freq: Every day | ORAL | Status: DC
  Administered 2015-03-08: 2 mg via ORAL
  Filled 2015-03-08 (×2): qty 1

## 2015-03-08 MED ORDER — QUETIAPINE FUMARATE 300 MG PO TABS
300.0000 mg | ORAL_TABLET | Freq: Every day | ORAL | Status: DC
  Filled 2015-03-08: qty 1

## 2015-03-08 MED ORDER — TRAZODONE HCL 100 MG PO TABS: 100.0000 mg | ORAL_TABLET | Freq: Every day | ORAL | Status: DC

## 2015-03-08 MED ORDER — STERILE WATER FOR INJECTION IJ SOLN
INTRAMUSCULAR | Status: AC
  Administered 2015-03-08: 1.2 mL via INTRAMUSCULAR
  Filled 2015-03-08: qty 10

## 2015-03-08 NOTE — ED Notes (Signed)
Pt ambulatory w/o difficulty from TCU 

## 2015-03-08 NOTE — ED Notes (Signed)
Pt changed into purple scrubs 

## 2015-03-08 NOTE — ED Notes (Signed)
Pt belongings moved to nurses station

## 2015-03-08 NOTE — ED Notes (Signed)
Report received from Lizbeth BarkJanie Rambo RN. Pt. Alert and oriented in no acute distress denies SI, HI, VH and pain.  Pt. States that she hears "Leslee Homeavid Keller". Pt. Instructed to come to me with problems or concerns.Will continue to monitor for safety via security cameras and Q 15 minute checks.

## 2015-03-08 NOTE — ED Notes (Signed)
Bed: WA28 Expected date:  Expected time:  Means of arrival:  Comments: 

## 2015-03-08 NOTE — ED Notes (Signed)
Pt. Noted sleeping in room. No complaints or concerns voiced. No distress or abnormal behavior noted. Will continue to monitor with security cameras. Q 15 minute rounds continue. 

## 2015-03-08 NOTE — ED Provider Notes (Signed)
Patient presented to the ER with her erratic and aggressive behavior. Patient brought to the emergency department by her son to be evaluated for her behavior. Patient has a long history of psychiatric illness, has not been taking her medications. Patient is paranoid and somewhat delusional. She is extremely agitated, angry and combative at times.  Face to face Exam: HEENT - PERRLA Lungs - CTAB Heart - RRR, no M/R/G Abd - S/NT/ND Neuro - alert, delusional, tangential  Plan: Medical screening exam performed, negative. Will require psychiatric evaluation.  Jodi Creasehristopher J Pollina, MD 03/08/15 [phone]

## 2015-03-08 NOTE — ED Notes (Signed)
Up to the bathroom 

## 2015-03-08 NOTE — BH Assessment (Signed)
Assessment completed. Consulted Hulan FessIjeoma Nwaeze, NP who recommended inpatient treatment once pt is medically cleared. Informed Sabino DickGail Shulz, NP of recommendation.

## 2015-03-08 NOTE — ED Notes (Signed)
Bed: WA29 Expected date:  Expected time:  Means of arrival:  Comments: 

## 2015-03-08 NOTE — ED Notes (Signed)
Pt. Noted in day room. No complaints or concerns voiced. No acute distress noted. Will continue to monitor with security cameras. Q 15 minute rounds continue.  

## 2015-03-08 NOTE — ED Notes (Signed)
Pt. Noted in room. No complaints or concerns voiced. No distress or abnormal behavior noted. Will continue to monitor with security cameras. Q 15 minute rounds continue. Snack and beverage given. 

## 2015-03-08 NOTE — ED Notes (Signed)
She is awake and very loquacious; verbalizing paranoid and some grandiose delusions.  She, thus far, is redirectable.  Her skin is normal, warm and dry and she is breathing normally.

## 2015-03-08 NOTE — Consult Note (Signed)
[facility] Face-to-Face Psychiatry Consult   Reason for Consult:  Psychosis Referring Physician:  EDP Patient Identification: Jodi Evans MRN:  [id number] Principal Diagnosis: Schizoaffective disorder, bipolar type [facility](HCC) Diagnosis:   Patient Active Problem List   Diagnosis Date Noted  . Schizoaffective disorder, bipolar type (HCC) [F25.0] 03/08/2015    Priority: High  . Psychosis [F29] 10/19/2014    Total Time spent with patient: 45 minutes  Subjective:   Jodi Evans is a 65 y.o. female patient's medications changed, reassess in am for stabilization.  HPI:  On admission:  65 y.o. [facility] being [facility] off by her son. Pt stated "I don't know why I am here". "This is for children and I'm not a child". 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 Pt reported that she lives with her son and stated "I get on his nerves because I forget things". "I walk around my neighbor because I feel safe". "I am my own protector". Pt denies SI and HI at this time. PT did not report any previous suicide attempts or self-injurious behaviors. Pt did not report any current mental health treatment. Pt stated "when I was discharge [facility] and the lady told me that I did to have to come back and you know I didn't go back". Pt did not report any alcohol or drug use but stated "I drink a little whisky for my cold when I was alone and no voices were in my head". Pt is endorsing auditory hallucinations and shared that one of the voices is abusive. Pt stated "his name is Leslee HomeDavid Keller and when he prays his name is Leslee [facility]David Keller [facility]". Pt also stated "he said that I married him in 2015 and gave him a child". Pt did not report any sexual or physical abuse at this time. Pt also reported that [place] lived in [place] and instead of playing with baby dolls she played with Teressa LowerJackie Kennedy".    Today:  The patient is less delusional and reports living with her son the past ten months until he brought her here due to her forgetting not to do things she should not.  She discusses her past medication trials and reports Zyprexa and Seroquel making her feel "in my own world, out in the horizon."  Reports good results with Prolixin.  Denies suicidal/homicidal ideations, substance abuse.  Past Psychiatric History: Schizoaffective disorder  Risk to Self: Suicidal Ideation: No Suicidal Intent: No Is patient at risk for suicide?: No Suicidal Plan?: No Access to Means: No What has been your use of drugs/alcohol within the last 12 months?: No alcohol or drug use reported.  How many times?: 0 Other Self Harm Risks: Pt denies  Triggers for Past Attempts: None known Intentional Self Injurious Behavior: None Risk to Others: Homicidal Ideation: No Thoughts of Harm to Others: No Current Homicidal Intent: No Current Homicidal Plan: No Access to Homicidal Means: No Identified Victim: N/A History of harm to others?: No Assessment of Violence: None Noted Violent Behavior Description: No violent behaviors observed at this time.  Does patient have access to weapons?: No Criminal Charges Pending?: No Does patient have a court date: No Prior Inpatient Therapy: Prior Inpatient Therapy: Yes Prior Therapy Dates: 11/2014 Prior Therapy Facilty/Provider(s): [facility]  Reason for Treatment: Bipolar  Prior Outpatient Therapy: Prior Outpatient Therapy:  (Unable to assess )  Does patient have an ACCT team?: Unknown Does patient have Intensive In-House Services?  : No Does patient have [facility] services? : No Does patient have P4CC services?: No  Past Medical History:  Past Medical History  Diagnosis Date  . Bipolar 1 disorder (HCC)   . Paranoid schizophrenia (HCC)   . Hypertension    History reviewed. No pertinent past surgical history. Family History: No family history on file. Family Psychiatric   History: None Social History:  History  Alcohol Use No     History  Drug Use No    Social History   Social History  . Marital Status: Single    Spouse Name: N/A  . Number of Children: N/A  . Years of Education: N/A   Social History Main Topics  . Smoking status: Former Games developer  . Smokeless tobacco: None  . Alcohol Use: No  . Drug Use: No  . Sexual Activity: Not Asked   Other Topics Concern  . None   Social History Narrative   Additional Social History:    History of alcohol / drug use?:  (Pt denies )                     Allergies:   Allergies  Allergen Reactions  . Penicillins     Has patient had a PCN reaction causing immediate rash, facial/tongue/throat swelling, SOB or lightheadedness with hypotension:denied allergy Has patient had a PCN reaction causing severe rash involving mucus membranes or skin necrosis: denied allergy Has patient had a PCN reaction that required hospitalization denied allergy Has patient had a PCN reaction occurring within the last 10 years:denied allergy If all of the above answers are "NO", then may proceed with Cephalosporin use. Patient denies a  . Shellfish Allergy     Seafood-turns legs purple    Labs:  Results for orders placed or performed during the hospital encounter of 03/07/15 (from the past 48 hour(s))  Urine rapid drug screen (hosp performed)     Status: None   Collection Time: 03/08/15  1:05 AM  Result Value Ref Range   Opiates NONE DETECTED NONE DETECTED   Cocaine NONE DETECTED NONE DETECTED   Benzodiazepines NONE DETECTED NONE DETECTED   Amphetamines NONE DETECTED NONE DETECTED   Tetrahydrocannabinol NONE DETECTED NONE DETECTED   Barbiturates NONE DETECTED NONE DETECTED    Comment:        DRUG SCREEN FOR MEDICAL PURPOSES ONLY.  IF CONFIRMATION IS NEEDED FOR ANY PURPOSE, NOTIFY LAB WITHIN 5 DAYS.        LOWEST DETECTABLE LIMITS FOR URINE DRUG SCREEN Drug Class       Cutoff (ng/mL) Amphetamine       1000 Barbiturate      200 Benzodiazepine   200 Tricyclics       300 Opiates          300 Cocaine          300 THC              50   CBC with Differential     Status: Abnormal   Collection Time: 03/08/15  4:48 AM  Result Value Ref Range   WBC 5.4 4.0 - 10.5 K/uL   RBC 4.44 3.87 - 5.11 MIL/uL   Hemoglobin 11.7 (L) 12.0 - 15.0 g/dL   HCT 91.4 (L) 78.2 - 95.6 %   MCV 78.2 78.0 - 100.0 fL   MCH 26.4 26.0 - 34.0 pg   MCHC 33.7 30.0 -  36.0 g/dL   RDW 16.1 09.6 - 04.5 %   Platelets 180 150 - 400 K/uL   Neutrophils Relative % 57 %   Neutro Abs 3.1 1.7 - 7.7 K/uL   Lymphocytes Relative 35 %   Lymphs Abs 1.9 0.7 - 4.0 K/uL   Monocytes Relative 6 %   Monocytes Absolute 0.3 0.1 - 1.0 K/uL   Eosinophils Relative 2 %   Eosinophils Absolute 0.1 0.0 - 0.7 K/uL   Basophils Relative 0 %   Basophils Absolute 0.0 0.0 - 0.1 K/uL  Ethanol     Status: None   Collection Time: 03/08/15  4:48 AM  Result Value Ref Range   Alcohol, Ethyl (B) <5 <5 mg/dL    Comment:        LOWEST DETECTABLE LIMIT FOR SERUM ALCOHOL IS 5 mg/dL FOR MEDICAL PURPOSES ONLY   I-stat chem 8, ed     Status: Abnormal   Collection Time: 03/08/15  4:57 AM  Result Value Ref Range   Sodium 145 135 - 145 mmol/L   Potassium 3.8 3.5 - 5.1 mmol/L   Chloride 109 101 - 111 mmol/L   BUN 35 (H) 6 - 20 mg/dL   Creatinine, Ser 4.09 (H) 0.44 - 1.00 mg/dL   Glucose, Bld 96 65 - 99 mg/dL   Calcium, Ion 8.11 9.14 - 1.30 mmol/L   TCO2 23 0 - 100 mmol/L   Hemoglobin 12.2 12.0 - 15.0 g/dL   HCT 78.2 95.6 - 21.3 %    Current Facility-Administered Medications  Medication Dose Route Frequency Provider Last Rate Last Dose  . benztropine (COGENTIN) tablet 1 mg  1 mg Oral BID Charm Rings, NP      . fluPHENAZine (PROLIXIN) tablet 5 mg  5 mg Oral BID Charm Rings, NP      . traZODone (DESYREL) tablet 100 mg  100 mg Oral QHS Charm Rings, NP       Current Outpatient Prescriptions  Medication Sig Dispense Refill  . benztropine  (COGENTIN) 0.5 MG tablet Take 0.5 mg by mouth daily.   0  . QUEtiapine (SEROQUEL) 300 MG tablet Take 300 mg by mouth at bedtime.  0  . risperiDONE (RISPERDAL) 2 MG tablet Take 2 mg by mouth daily.   0  . amLODipine (NORVASC) 5 MG tablet Take 1 tablet (5 mg total) by mouth daily. (Patient not taking: Reported on 03/07/2015) 30 tablet 0    Musculoskeletal: Strength & Muscle Tone: within normal limits Gait & Station: normal Patient leans: N/A  Psychiatric Specialty Exam: Review of Systems  Constitutional: Negative.   HENT: Negative.   Eyes: Negative.   Respiratory: Negative.   Cardiovascular: Negative.   Gastrointestinal: Negative.   Genitourinary: Negative.   Musculoskeletal: Negative.   Skin: Negative.   Neurological: Negative.   Endo/Heme/Allergies: Negative.   Psychiatric/Behavioral: Positive for hallucinations. The patient has insomnia.     Blood pressure 129/78, pulse 90, temperature 98.1 F (36.7 C), temperature source Oral, resp. rate 18, SpO2 100 %.There is no height or weight on file to calculate BMI.  General Appearance: Casual  Eye Contact::  Fair  Speech:  Normal Rate  Volume:  Normal  Mood:  Euphoric  Affect:  Flat  Thought Process:  Coherent  Orientation:  Full (Time, Place, and Person)  Thought Content:  Delusions  Suicidal Thoughts:  No  Homicidal Thoughts:  No  Memory:  Immediate;   Fair Recent;   Fair Remote;   Fair  Judgement:  Fair  Insight:  Fair  Psychomotor Activity:  Normal  Concentration:  Fair  Recall:  [employer] of Knowledge:Fair  Language: Good  Akathisia:  No  Handed:  Right  AIMS (if indicated):     Assets:  Housing Leisure Time Physical Health Resilience Social Support  ADL's:  Intact  Cognition: WNL  Sleep:      Treatment Plan Summary: Daily contact with patient to assess and evaluate symptoms and progress in treatment, Medication management and Plan schizoaffective disorder, bipolar type:  -Crisis stabilization -Medication  management:  Prolixin 5 mg BID for psychosis, cogentin 1 mg daily to prevent EPS, and Trazodone 50 mg at bedtime for sleep issues started -Individual counseling  Disposition: Supportive therapy provided about ongoing stressors.  Nanine Means, PMH-NP 03/08/2015 1:53 PM Patient seen face-to-face for psychiatric evaluation, chart reviewed and case discussed with the physician extender and developed treatment plan. Reviewed the information documented and agree with the treatment plan. Thedore Mins, MD

## 2015-03-08 NOTE — ED Notes (Signed)
Pt resting in bed with eyes closed. Visible rise and fall seen to chest

## 2015-03-08 NOTE — ED Notes (Signed)
Pt unwilling to cooperate with assessment. Pt is paranoid and states, "I can read minds." Pt states she is unwilling to give blood or urine at this time. Pt appears to be responding to external stimuli and is pacing in the room

## 2015-03-08 NOTE — ED Notes (Signed)
Pt ambulated to restroom with steady gait.

## 2015-03-08 NOTE — ED Notes (Signed)
Delay in labs, pt being uncooperative

## 2015-03-08 NOTE — ED Notes (Signed)
Pt's behavior became increasingly erratic and aggressive. Pt was pacing in and out of room and had to be told not to enter other pt's rooms.

## 2015-03-08 NOTE — BH Assessment (Signed)
Tele Assessment Note   Jodi [name] is an 65 y.o. female [facility] being dropped off by her son. Pt stated "I don't know why I am here". "This is for children and I'm not a child". 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 Pt reported that she lives with her son and stated "I get on his nerves because I forget things". "I walk around my neighbor because I feel safe". "I am my own protector". Pt denies SI and HI at this time. PT did not report any previous suicide attempts or self-injurious behaviors. Pt did not report any current mental health treatment. Pt stated "when I was discharge from [facility] and the lady told me that I did to have to come back and you know I didn't go back". Pt did not report any alcohol or drug use but stated "I drink a little whisky for my cold when I was alone and no voices were in my head". Pt is endorsing auditory hallucinations and shared that one of the voices is abusive. Pt stated "his name is Jodi Evans and when he prays his name is Jodi Evans [facility]". Pt also stated "he said that I married him in 2015 and gave him a child". Pt did not report any sexual or physical abuse at this time. Pt also reported that she lived [place] the [place] and instead of playing with baby dolls she played with Jodi LowerJackie Evans".  Inpatient treatment is recommended.   Diagnosis: Paranoid Schizophrenia   Past Medical History:  Past Medical History  Diagnosis Date  . Bipolar 1 disorder (HCC)   . Paranoid schizophrenia (HCC)   . Hypertension     History reviewed. No pertinent past surgical history.  Family History: No family history on file.  Social History:  reports that she has quit smoking. She does not have any smokeless tobacco history on file. She reports that she does not drink alcohol or use illicit drugs.  Additional Social History:  Alcohol / Drug  Use History of alcohol / drug use?:  (Pt denies )  CIWA: CIWA-Ar BP: 154/97 mmHg Pulse Rate: 82 COWS:    PATIENT STRENGTHS: (choose at least two) Average or above average intelligence  Allergies:  Allergies  Allergen Reactions  . Penicillins     Has patient had a PCN reaction causing immediate rash, facial/tongue/throat swelling, SOB or lightheadedness with hypotension:denied allergy Has patient had a PCN reaction causing severe rash involving mucus membranes or skin necrosis: denied allergy Has patient had a PCN reaction that required hospitalization denied allergy Has patient had a PCN reaction occurring within the last 10 years:denied allergy If all of the above answers are "NO", then may proceed with Cephalosporin use. Patient denies a  . Shellfish Allergy     Seafood-turns legs purple    Home Medications:  (Not in a hospital admission)  OB/GYN Status:  No LMP recorded. Patient is postmenopausal.  General Assessment Data Location of Assessment: [facility] TTS Assessment: In system Is this a Tele or Face-to-Face Assessment?: Face-to-Face Is this an Initial Assessment or a Re-assessment for this encounter?: Initial Assessment Living Arrangements: Children Can pt return to current living arrangement?: Yes Admission Status: Voluntary Is patient capable of signing voluntary admission?: Yes Referral Source: Self/Family/Friend Insurance type: Medicare      Crisis Care Plan Living Arrangements: Children  Name of Psychiatrist: Unable to assess Name of Therapist: Unable to assess  Education Status Is patient currently in school?: No Current Grade: N/A Highest grade of school patient has completed: N/A Name of school: N/A Contact person: N/A  Risk to self with the past 6 months Suicidal Ideation: No Has patient been a risk to self within the past 6 months prior to admission? : No Suicidal Intent: No Has patient had any suicidal intent within the past 6 months prior to  admission? : No Is patient at risk for suicide?: No Suicidal Plan?: No Has patient had any suicidal plan within the past 6 months prior to admission? : No Access to Means: No What has been your use of drugs/alcohol within the last 12 months?: No alcohol or drug use reported.  Previous Attempts/Gestures: No How many times?: 0 Other Self Harm Risks: Pt denies  Triggers for Past Attempts: None known Intentional Self Injurious Behavior: None Family Suicide History: Unable to assess Recent stressful life event(s):  (Pt denies ) Persecutory voices/beliefs?:  (Unable to assess) Depression:  (Unable to assess ) Depression Symptoms:  (Unable to assess ) Substance abuse history and/or treatment for substance abuse?: No Suicide prevention information given to non-admitted patients: Not applicable  Risk to Others within the past 6 months Homicidal Ideation: No Does patient have any lifetime risk of violence toward others beyond the six months prior to admission? : No Thoughts of Harm to Others: No Current Homicidal Intent: No Current Homicidal Plan: No Access to Homicidal Means: No Identified Victim: N/A History of harm to others?: No Assessment of Violence: None Noted Violent Behavior Description: No violent behaviors observed at this time.  Does patient have access to weapons?: No Criminal Charges Pending?: No Does patient have a court date: No Is patient on probation?: No  Psychosis Hallucinations: Auditory Delusions: Unspecified  Mental Status Report Appearance/Hygiene: Unremarkable Eye Contact: Good Motor Activity: Freedom of movement Speech: Unremarkable Level of Consciousness: Irritable Mood: Labile Affect: Labile Anxiety Level: Minimal Thought Processes: Circumstantial Judgement: Partial Orientation: Appropriate for developmental age Obsessive Compulsive Thoughts/Behaviors: Unable to Assess  Cognitive Functioning Concentration: Fair Memory: Unable to Assess IQ:  Average Insight: Poor Impulse Control: Fair Appetite: Good Weight Loss: 0 Weight Gain: 0 Sleep: Unable to Assess Vegetative Symptoms: Unable to Assess  ADLScreening [facility] Assessment Services) Patient's cognitive ability adequate to safely complete daily activities?: Yes Patient able to express need for assistance with ADLs?: Yes Independently performs ADLs?: Yes (appropriate for developmental age)  Prior Inpatient Therapy Prior Inpatient Therapy: Yes Prior Therapy Dates: 11/2014 Prior Therapy Facilty/Provider(s): [facility]  Reason for Treatment: Bipolar   Prior Outpatient Therapy Prior Outpatient Therapy:  (Unable to assess ) Does patient have an ACCT team?: Unknown Does patient have Intensive In-House Services?  : No Does patient have [facility] services? : No Does patient have P4CC services?: No  ADL Screening (condition at time of admission) Patient's cognitive ability adequate to safely complete daily activities?: Yes Patient able to express need for assistance with ADLs?: Yes Independently performs ADLs?: Yes (appropriate for developmental age)       Abuse/Neglect Assessment (Assessment to be complete while patient is alone) Physical Abuse:  (Unable to assess ) Verbal Abuse:  (Unable to assess ) Sexual Abuse:  (Unable to assess ) Exploitation of patient/patient's resources:  (Unable to assess.) Self-Neglect:  (Unable to assess )     Advance Directives (For Healthcare) Does patient have an advance directive?: No    Additional Information 1:1 In Past  12 Months?: No CIRT Risk: No Elopement Risk: No Does patient have medical clearance?: No (Labs pending )     Disposition:  Disposition Initial Assessment Completed for this Encounter: Yes Disposition of Patient: Inpatient treatment program Type of inpatient treatment program: Adult  Michaell Grider S 03/08/2015 12:55 AM

## 2015-03-08 NOTE — ED Notes (Signed)
When phlebotomist stated pt would need blood drawn, pt stated "Oh no we're not doing that."

## 2015-03-09 MED ORDER — OXCARBAZEPINE 300 MG PO TABS
300.0000 mg | ORAL_TABLET | Freq: Two times a day (BID) | ORAL | Status: DC
  Administered 2015-03-09 – 2015-03-11 (×5): 300 mg via ORAL
  Filled 2015-03-09 (×6): qty 1

## 2015-03-09 MED ORDER — TRAZODONE HCL 100 MG PO TABS
100.0000 mg | ORAL_TABLET | Freq: Every day | ORAL | Status: DC
  Administered 2015-03-09 – 2015-03-10 (×2): 100 mg via ORAL
  Filled 2015-03-09 (×2): qty 1

## 2015-03-09 NOTE — ED Notes (Signed)
Pt. Noted in room. No complaints or concerns voiced. No distress or abnormal behavior noted. Will continue to monitor with security cameras. Q 15 minute rounds continue. 

## 2015-03-09 NOTE — Consult Note (Signed)
[facility] Face-to-Face Psychiatry Consult   Reason for Consult:  Psychosis Referring Physician:  EDP Patient Identification: Jodi NumberSusie Barbee MRN:  [id number] Principal Diagnosis: Schizoaffective disorder, bipolar type [facility](HCC) Diagnosis:   Patient Active Problem List   Diagnosis Date Noted  . Schizoaffective disorder, bipolar type (HCC) [F25.0] 03/08/2015    Priority: High  . Psychosis [F29] 10/19/2014    Total Time spent with patient: 30 minutes  Subjective:   Jodi Evans is a 65 y.o. female is disorganized and delusional.  HPI:  On admission:  65 [facility]. [facility] being dropped off by her son. Pt stated "I don't know why I am here". "This is for children and I'm not a child". 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 Pt reported that she lives with her son and stated "I get on his nerves because I forget things". "I walk around my neighbor because I feel safe". "I am my own protector". Pt denies SI and HI at this time. PT did not report any previous suicide attempts or self-injurious behaviors. Pt did not report any current mental health treatment. Pt stated "[facility] was [facility] and the lady told me that I did to have to come back and you know I didn't go back". Pt did not report any alcohol or drug use but stated "I drink a little whisky for my cold when I was alone and no voices were in my head". Pt is endorsing auditory hallucinations and shared that one of the voices is abusive. Pt stated "his name is Leslee HomeDavid Keller and when he prays his name is Leslee HomeDavid Keller [facility]". Pt also stated "he said that I married him in 2015 and gave him a child". Pt did not report any sexual or physical abuse at this time. Pt also reported that [place] and instead of playing with baby dolls she played with Teressa LowerJackie Kennedy".   Today:  The patient is  disorganized on assessment, irritable.  She told this provider in the hall, "I'm not going to stop talking."  When the provider said, "Ok", she said, "It's not ok."  Continues to be delusional at times and intrusive.  Past Psychiatric History: Schizoaffective disorder  Risk to Self: Suicidal Ideation: No Suicidal Intent: No Is patient at risk for suicide?: No Suicidal Plan?: No Access to Means: No What has been your use of drugs/alcohol within the last 12 months?: No alcohol or drug use reported.  How many times?: 0 Other Self Harm Risks: Pt denies  Triggers for Past Attempts: None known Intentional Self Injurious Behavior: None Risk to Others: Homicidal Ideation: No Thoughts of Harm to Others: No Current Homicidal Intent: No Current Homicidal Plan: No Access to Homicidal Means: No Identified Victim: N/A History of harm to others?: No Assessment of Violence: None Noted Violent Behavior Description: No violent behaviors observed at this time.  Does patient have access to weapons?: No Criminal Charges Pending?: No Does patient have a court date: No Prior Inpatient Therapy: Prior Inpatient Therapy: Yes Prior Therapy Dates: 11/2014 Prior Therapy Facilty/Provider(s): [facility]  Reason for Treatment: Bipolar  Prior Outpatient Therapy: Prior Outpatient Therapy:  (Unable to assess ) Does patient have an ACCT team?: Unknown Does patient have Intensive In-House Services?  : No Does patient have [facility] services? : No Does patient have P4CC services?:  No  Past Medical History:  Past Medical History  Diagnosis Date  . Bipolar 1 disorder (HCC)   . Paranoid schizophrenia (HCC)   . Hypertension    History reviewed. No pertinent past surgical history. Family History: No family history on file. Family Psychiatric  History: None Social History:  History  Alcohol Use No     History  Drug Use No    Social History   Social History  . Marital Status: Single    Spouse Name: N/A  .  Number of Children: N/A  . Years of Education: N/A   Social History Main Topics  . Smoking status: Former Games developer  . Smokeless tobacco: None  . Alcohol Use: No  . Drug Use: No  . Sexual Activity: Not Asked   Other Topics Concern  . None   Social History Narrative   Additional Social History:    History of alcohol / drug use?:  (Pt denies )                     Allergies:   Allergies  Allergen Reactions  . Penicillins     Has patient had a PCN reaction causing immediate rash, facial/tongue/throat swelling, SOB or lightheadedness with hypotension:denied allergy Has patient had a PCN reaction causing severe rash involving mucus membranes or skin necrosis: denied allergy Has patient had a PCN reaction that required hospitalization denied allergy Has patient had a PCN reaction occurring within the last 10 years:denied allergy If all of the above answers are "NO", then may proceed with Cephalosporin use. Patient denies a  . Shellfish Allergy     Seafood-turns legs purple    Labs:  Results for orders placed or performed during the hospital encounter of 03/07/15 (from the past 48 hour(s))  Urine rapid drug screen (hosp performed)     Status: None   Collection Time: 03/08/15  1:05 AM  Result Value Ref Range   Opiates NONE DETECTED NONE DETECTED   Cocaine NONE DETECTED NONE DETECTED   Benzodiazepines NONE DETECTED NONE DETECTED   Amphetamines NONE DETECTED NONE DETECTED   Tetrahydrocannabinol NONE DETECTED NONE DETECTED   Barbiturates NONE DETECTED NONE DETECTED    Comment:        DRUG SCREEN FOR MEDICAL PURPOSES ONLY.  IF CONFIRMATION IS NEEDED FOR ANY PURPOSE, NOTIFY LAB WITHIN 5 DAYS.        LOWEST DETECTABLE LIMITS FOR URINE DRUG SCREEN Drug Class       Cutoff (ng/mL) Amphetamine      1000 Barbiturate      200 Benzodiazepine   200 Tricyclics       300 Opiates          300 Cocaine          300 THC              50   CBC with Differential     Status:  Abnormal   Collection Time: 03/08/15  4:48 AM  Result Value Ref Range   WBC 5.4 4.0 - 10.5 K/uL   RBC 4.44 3.87 - 5.11 MIL/uL   Hemoglobin 11.7 (L) 12.0 - 15.0 g/dL   HCT 40.9 (L) 81.1 - 91.4 %   MCV 78.2 78.0 - 100.0 fL   MCH 26.4 26.0 - 34.0 pg   MCHC 33.7 30.0 - 36.0 g/dL   RDW 78.2 95.6 - 21.3 %   Platelets 180 150 - 400 K/uL   Neutrophils Relative % 57 %   Neutro  Abs 3.1 1.7 - 7.7 K/uL   Lymphocytes Relative 35 %   Lymphs Abs 1.9 0.7 - 4.0 K/uL   Monocytes Relative 6 %   Monocytes Absolute 0.3 0.1 - 1.0 K/uL   Eosinophils Relative 2 %   Eosinophils Absolute 0.1 0.0 - 0.7 K/uL   Basophils Relative 0 %   Basophils Absolute 0.0 0.0 - 0.1 K/uL  Ethanol     Status: None   Collection Time: 03/08/15  4:48 AM  Result Value Ref Range   Alcohol, Ethyl (B) <5 <5 mg/dL    Comment:        LOWEST DETECTABLE LIMIT FOR SERUM ALCOHOL IS 5 mg/dL FOR MEDICAL PURPOSES ONLY   I-stat chem 8, ed     Status: Abnormal   Collection Time: 03/08/15  4:57 AM  Result Value Ref Range   Sodium 145 135 - 145 mmol/L   Potassium 3.8 3.5 - 5.1 mmol/L   Chloride 109 101 - 111 mmol/L   BUN 35 (H) 6 - 20 mg/dL   Creatinine, Ser 4.54 (H) 0.44 - 1.00 mg/dL   Glucose, Bld 96 65 - 99 mg/dL   Calcium, Ion 0.98 1.19 - 1.30 mmol/L   TCO2 23 0 - 100 mmol/L   Hemoglobin 12.2 12.0 - 15.0 g/dL   HCT 14.7 82.9 - 56.2 %    Current Facility-Administered Medications  Medication Dose Route Frequency Provider Last Rate Last Dose  . benztropine (COGENTIN) tablet 1 mg  1 mg Oral BID Charm Rings, NP   1 mg at 03/08/15 2100  . fluPHENAZine (PROLIXIN) tablet 5 mg  5 mg Oral BID Charm Rings, NP   5 mg at 03/09/15 0844  . traZODone (DESYREL) tablet 50 mg  50 mg Oral QHS Charm Rings, NP   50 mg at 03/08/15 2100   Current Outpatient Prescriptions  Medication Sig Dispense Refill  . benztropine (COGENTIN) 0.5 MG tablet Take 0.5 mg by mouth daily.   0  . QUEtiapine (SEROQUEL) 300 MG tablet Take 300 mg by mouth at  bedtime.  0  . risperiDONE (RISPERDAL) 2 MG tablet Take 2 mg by mouth daily.   0  . amLODipine (NORVASC) 5 MG tablet Take 1 tablet (5 mg total) by mouth daily. (Patient not taking: Reported on 03/07/2015) 30 tablet 0    Musculoskeletal: Strength & Muscle Tone: within normal limits Gait & Station: normal Patient leans: N/A  Psychiatric Specialty Exam: Review of Systems  Constitutional: Negative.   HENT: Negative.   Eyes: Negative.   Respiratory: Negative.   Cardiovascular: Negative.   Gastrointestinal: Negative.   Genitourinary: Negative.   Musculoskeletal: Negative.   Skin: Negative.   Neurological: Negative.   Endo/Heme/Allergies: Negative.   Psychiatric/Behavioral: Positive for hallucinations.    Blood pressure 139/70, pulse 81, temperature 98.2 F (36.8 C), temperature source Oral, resp. rate 18, SpO2 100 %.There is no height or weight on file to calculate BMI.  General Appearance: Casual  Eye Contact::  Good  Speech:  Normal Rate  Volume:  Normal  Mood:   Irritable  Affect:  Flat  Thought Process:  Disorganized, irrelevant   Orientation:  Full (Time, Place, and Person)  Thought Content:  Delusions, hallucinations  Suicidal Thoughts:  No  Homicidal Thoughts:  No  Memory:  Immediate, fair; Remote, fair; Recent, fair  Judgement:  Impaired  Insight:  Fair  Psychomotor Activity:  Normal  Concentration:  Fair  Recall:  [employer] of Knowledge:Fair  Language: Peri [place]  Akathisia:  No  Handed:  Right  AIMS (if indicated):     Assets:  Housing Leisure Time Physical Health Resilience Social Support  ADL's:  Intact  Cognition: WNL  Sleep:      Treatment Plan Summary: Daily contact with patient to assess and evaluate symptoms and progress in treatment, Medication management and Plan schizoaffective disorder, bipolar type:  -Crisis stabilization -Medication management:  Prolixin 5 mg BID for psychosis, cogentin 1 mg daily to prevent EPS increased to BID, and Trazodone 50  mg increased to 100 mg at bedtime for sleep issues c -Individual counseling  Disposition: discharge home with Rx and outpatient resources  Nanine Means, PMH-NP 03/09/2015 9:28 AM Patient seen face-to-face for psychiatric evaluation, chart reviewed and case discussed with the physician extender and developed treatment plan. Reviewed the information documented and agree with the treatment plan. Thedore Mins, MD

## 2015-03-09 NOTE — ED Notes (Signed)
Up on the phone 

## 2015-03-09 NOTE — ED Notes (Signed)
Pt. Noted in hall. No complaints or concerns voiced. No acute distress but still noting some delusional behavior. Will continue to monitor with security cameras. Q 15 minute rounds continue.

## 2015-03-09 NOTE — ED Notes (Signed)
Pt. Noted sleeping in room. No complaints or concerns voiced. No distress or abnormal behavior noted. Will continue to monitor with security cameras. Q 15 minute rounds continue. 

## 2015-03-09 NOTE — ED Notes (Signed)
Pt. Noted in room. No complaints or concerns voiced. No acute distress noted. Will continue to monitor with security cameras. Q 15 minute rounds continue. 

## 2015-03-09 NOTE — ED Notes (Signed)
Report received from Janie Rambo RN. Pt. Alert and oriented in no distress denies SI, HI, AVH and pain.  Pt. Instructed to come to me with problems or concerns.Will continue to monitor for safety via security cameras and Q 15 minute checks. 

## 2015-03-09 NOTE — Progress Notes (Signed)
Disposition CSW completed patient referrals to the following inpatient psych facilities:  [facility]  CSW will continue to follow patient as needed for placement needs.  Seward SpeckLeo Carlene Bickley [facility] Center[facility],LCAS Behavioral Health Disposition CSW [phone]

## 2015-03-09 NOTE — ED Notes (Signed)
In day room watching tv 

## 2015-03-09 NOTE — ED Notes (Signed)
Up at the desk pleasant,smiling, pt reports that she is going to talk w/ social work Advertising account executivetomorrow.  She reports that they tried to get her to go the a home last time, but she was ready. She thinks that she is ready now.

## 2015-03-09 NOTE — ED Notes (Signed)
Pt. Noted in rest room. No complaints or concerns voiced. No distress or abnormal behavior noted. Will continue to monitor with security cameras. Q 15 minute rounds continue.  

## 2015-03-10 LAB — URINALYSIS, ROUTINE W REFLEX MICROSCOPIC
Bilirubin Urine: NEGATIVE
GLUCOSE, UA: NEGATIVE mg/dL
Hgb urine dipstick: NEGATIVE
Ketones, ur: NEGATIVE mg/dL
Nitrite: NEGATIVE
PROTEIN: NEGATIVE mg/dL
SPECIFIC GRAVITY, URINE: 1.016 (ref 1.005–1.030)
pH: 6 (ref 5.0–8.0)

## 2015-03-10 LAB — URINE MICROSCOPIC-ADD ON

## 2015-03-10 NOTE — ED Notes (Signed)
Pt. Noted in room. No complaints or concerns voiced. No acute distress noted. Will continue to monitor with security cameras. Q 15 minute rounds continue. 

## 2015-03-10 NOTE — Consult Note (Signed)
Pending review for possible placement with [facility].  

## 2015-03-10 NOTE — ED Notes (Signed)
Patient in room watching TV.  She is pleasant, disorganized speech.  She states, "too much talking out there.  They are calling me crazy."  When asked who was calling she replied, "Bobette MoCasper the ghost."  Patient will pace hallway periodically.  Attempted to help her put her dirty laundry in hamper and patient became irritable, "I'll do it myself.  I don't your damn help."  She remains with labile mood and disorganized thought process.

## 2015-03-10 NOTE — ED Notes (Signed)
Pt. Noted sleeping in room. No complaints or concerns voiced. No distress or abnormal behavior noted. Will continue to monitor with security cameras. Q 15 minute rounds continue. 

## 2015-03-10 NOTE — BHH Counselor (Addendum)
[facility].  Accepting physician is Dr. Ardyth HarpsHernandez.  Call report to [phone].  Dr.Hernandez is requesting urinalysis. Writer has informed TTS Irving Burton(Emily).

## 2015-03-10 NOTE — BHH Counselor (Signed)
Pt [facility] BH bed assignment has been reassigned to bed 302 by [employer] RN Phyllis.

## 2015-03-10 NOTE — ED Notes (Signed)
Patient pleasant, cooperative at present. Denies all complaints. Inquires about her discharge.  Encouragement offered. Environment adjusted.  Q 15 safety checks continue.

## 2015-03-10 NOTE — Progress Notes (Signed)
CSW consulted with TTS who states that the patient has been accepted to [facility] and that the patient is welcomed to go to their facility tonight.  CSW reached out to [employer] CorporationSergeant Pascal. He states that the patient cannot be transported tonight. He states that the patient will be transported in the morning.  Trish MageBrittney Dayna Geurts, LCSWA [phone] ED CSW 03/10/2015 7:28 PM

## 2015-03-10 NOTE — BH Assessment (Signed)
BHH Assessment Progress Note  Reassessed pt, who was sitting up on her bed, watching TV. Pt states, "I feel good, I am just trying to calm down now--I am having ups and down". At times pt was irritable with disorganized, tangential speech, and she made verbal threats to harm someone she called "him", but refused to elaborate. She denies SI, HI, AVH.

## 2015-03-10 NOTE — BH Assessment (Signed)
BHH Assessment Progress Note   The following facilities have been contacted to seek placement for this pt, with results as noted:  Beds available, information sent, decision pending:  [facility] [place]    Lee[place]Ann Gal, [place] Triage Specialist [phone]

## 2015-03-10 NOTE — Progress Notes (Addendum)
Pt stated [place] moved from [place] about 10 months ago and had a pcp there but not one in [place] Cm provided pt with a [place] of medicare providers [place] using medicare.gov  Pt appreciative of resources offered to her Pt pleasant and cooperative   Entered in d/c instructions Please use the list of medicare internal medicine doctors provided to you by the ED case manager to find a Dr for follow up care Schedule an appointment as soon as possible for a visit As needed

## 2015-03-10 NOTE — Progress Notes (Signed)
Patient gave urine sample.  Urinalysis in process.  Pending [facility].

## 2015-03-11 ENCOUNTER — Inpatient Hospital Stay
Admission: EM | Admit: 2015-03-11 | Discharge: 2015-03-18 | DRG: 885 | Disposition: A | Discharge status: Home / Self Care | Payer: Medicare Other | Source: Intra-hospital | Attending: Psychiatry | Admitting: Psychiatry

## 2015-03-11 DIAGNOSIS — G4700 Insomnia, unspecified: Secondary | ICD-10-CM | POA: Diagnosis present

## 2015-03-11 DIAGNOSIS — Z880 Allergy status to penicillin: Secondary | ICD-10-CM | POA: Diagnosis not present

## 2015-03-11 DIAGNOSIS — F250 Schizoaffective disorder, bipolar type: Secondary | ICD-10-CM | POA: Diagnosis present

## 2015-03-11 DIAGNOSIS — Z87891 Personal history of nicotine dependence: Secondary | ICD-10-CM | POA: Diagnosis not present

## 2015-03-11 DIAGNOSIS — Z9114 Patient's other noncompliance with medication regimen: Secondary | ICD-10-CM

## 2015-03-11 DIAGNOSIS — Z590 Homelessness: Secondary | ICD-10-CM | POA: Diagnosis not present

## 2015-03-11 DIAGNOSIS — F29 Unspecified psychosis not due to a substance or known physiological condition: Secondary | ICD-10-CM | POA: Diagnosis not present

## 2015-03-11 DIAGNOSIS — I10 Essential (primary) hypertension: Secondary | ICD-10-CM | POA: Diagnosis present

## 2015-03-11 DIAGNOSIS — Z818 Family history of other mental and behavioral disorders: Secondary | ICD-10-CM

## 2015-03-11 DIAGNOSIS — N390 Urinary tract infection, site not specified: Secondary | ICD-10-CM | POA: Diagnosis present

## 2015-03-11 DIAGNOSIS — Z79899 Other long term (current) drug therapy: Secondary | ICD-10-CM | POA: Diagnosis not present

## 2015-03-11 MED ORDER — IBUPROFEN 200 MG PO TABS
400.0000 mg | ORAL_TABLET | Freq: Four times a day (QID) | ORAL | Status: DC | PRN
Start: 2015-03-11 — End: 2015-03-11
  Administered 2015-03-11: 400 mg via ORAL
  Filled 2015-03-11: qty 2

## 2015-03-11 MED ORDER — ALUM & MAG HYDROXIDE-SIMETH 200-200-20 MG/5ML PO SUSP: 30.0000 mL | ORAL | Status: DC | PRN

## 2015-03-11 MED ORDER — FLUPHENAZINE HCL 5 MG PO TABS
5.0000 mg | ORAL_TABLET | Freq: Two times a day (BID) | ORAL | Status: DC
  Administered 2015-03-11 – 2015-03-18 (×14): 5 mg via ORAL
  Filled 2015-03-11 (×15): qty 1

## 2015-03-11 MED ORDER — LORAZEPAM 2 MG/ML IJ SOLN: 1.0000 mg | Freq: Four times a day (QID) | INTRAMUSCULAR | Status: DC | PRN

## 2015-03-11 MED ORDER — AMLODIPINE BESYLATE 5 MG PO TABS
5.0000 mg | ORAL_TABLET | Freq: Every day | ORAL | Status: DC
  Administered 2015-03-11 – 2015-03-18 (×8): 5 mg via ORAL
  Filled 2015-03-11 (×8): qty 1

## 2015-03-11 MED ORDER — LORAZEPAM 1 MG PO TABS
1.0000 mg | ORAL_TABLET | Freq: Four times a day (QID) | ORAL | Status: DC | PRN
Start: 2015-03-11 — End: 2015-03-17

## 2015-03-11 MED ORDER — TRAZODONE HCL 100 MG PO TABS
100.0000 mg | ORAL_TABLET | Freq: Every day | ORAL | Status: DC
  Administered 2015-03-12 – 2015-03-14 (×3): 100 mg via ORAL
  Filled 2015-03-11 (×4): qty 1

## 2015-03-11 MED ORDER — ACETAMINOPHEN 325 MG PO TABS
650.0000 mg | ORAL_TABLET | Freq: Four times a day (QID) | ORAL | Status: DC | PRN
  Administered 2015-03-13: 650 mg via ORAL
  Filled 2015-03-11: qty 2

## 2015-03-11 MED ORDER — MAGNESIUM HYDROXIDE 400 MG/5ML PO SUSP: 30.0000 mL | Freq: Every day | ORAL | Status: DC | PRN

## 2015-03-11 MED ORDER — BENZTROPINE MESYLATE 1 MG PO TABS
1.0000 mg | ORAL_TABLET | Freq: Two times a day (BID) | ORAL | Status: DC
  Administered 2015-03-11 – 2015-03-18 (×14): 1 mg via ORAL
  Filled 2015-03-11 (×14): qty 1

## 2015-03-11 NOTE — BH Assessment (Signed)
[facility].  Accepting physician is Dr. Ardyth HarpsHernandez. Call report to [phone]. Patient has signed support paperwork. Patient voluntary. Patient to be transferred to [facility] via [employer].

## 2015-03-11 NOTE — Tx Team (Signed)
Initial Interdisciplinary Treatment Plan   PATIENT STRESSORS: Medication change or noncompliance Traumatic event   PATIENT STRENGTHS: Average or above average intelligence Communication skills   PROBLEM LIST: Problem List/Patient Goals Date to be addressed Date deferred Reason deferred Estimated date of resolution  Bipolar 1 disorder 03/11/2015     Paranoid Schizophrenia 03/11/2015                                                DISCHARGE CRITERIA:  Medical problems require only outpatient monitoring Reduction of life-threatening or endangering symptoms to within safe limits  PRELIMINARY DISCHARGE PLAN: Attend aftercare/continuing care group Placement in alternative living arrangements  PATIENT/FAMIILY INVOLVEMENT: This treatment plan has been presented to and reviewed with the patient, Jodi [name], and/or family member,   The patient and family have been given the opportunity to ask questions and make suggestions.  Jodi CommonGigi Evans Jodi Evans 03/11/2015, 4:41 PM

## 2015-03-11 NOTE — ED Notes (Signed)
Patient transferred to [facility].  Left the unit ambulatory with [employer].  All belongings given to the driver.

## 2015-03-11 NOTE — Progress Notes (Signed)
65 yr old female admitted with paranoid schizophrenia.Skin assessment & body search done,no contraband found on admission.He denies suicidal & homicidal ideation.Unit orientation given & made comfortable in room.

## 2015-03-11 NOTE — Plan of Care (Signed)
Problem: Ineffective individual coping Goal: STG-Increase in ability to manage activities of daily living Outcome: Progressing Independent with ADLs

## 2015-03-12 DIAGNOSIS — N390 Urinary tract infection, site not specified: Secondary | ICD-10-CM | POA: Diagnosis present

## 2015-03-12 DIAGNOSIS — F250 Schizoaffective disorder, bipolar type: Principal | ICD-10-CM

## 2015-03-12 LAB — TSH: TSH: 1.376 u[IU]/mL (ref 0.350–4.500)

## 2015-03-12 LAB — LIPID PANEL
CHOL/HDL RATIO: 3.1 ratio
CHOLESTEROL: 179 mg/dL (ref 0–200)
HDL: 58 mg/dL (ref >40)
LDL Cholesterol: 101 mg/dL — ABNORMAL HIGH (ref 0–99)
TRIGLYCERIDES: 99 mg/dL (ref <150)
VLDL: 20 mg/dL (ref 0–40)

## 2015-03-12 MED ORDER — FLUPHENAZINE DECANOATE 25 MG/ML IJ SOLN
50.0000 mg | INTRAMUSCULAR | Status: DC
  Administered 2015-03-12: 50 mg via INTRAMUSCULAR
  Filled 2015-03-12: qty 2

## 2015-03-12 MED ORDER — FOSFOMYCIN TROMETHAMINE 3 G PO PACK
3.0000 g | PACK | Freq: Once | ORAL | Status: AC
  Administered 2015-03-12: 3 g via ORAL
  Filled 2015-03-12: qty 3

## 2015-03-12 NOTE — Progress Notes (Signed)
Patient ID: Jodi NumberSusie Roldan, female   DOB: [birth year], 65 y.o.   MRN: [id number] PER STATE REGULATIONS 482.30  THIS CHART WAS REVIEWED FOR MEDICAL NECESSITY WITH RESPECT TO THE PATIENT'S ADMISSION/DURATION OF STAY.  NEXT REVIEW DATE:03/15/15  Loura HaltBARBARA Rie Mcneil, RN, BSN CASE MANAGER

## 2015-03-12 NOTE — [facility] Suicide Risk Assessment (Signed)
[facility]   Nursing information obtained from:    Demographic factors:    Current Mental Status:    Loss Factors:    Historical Factors:    Risk Reduction Factors:    Total Time spent with patient: 1 hour Principal Problem: Schizoaffective disorder, bipolar type (HCC) Diagnosis:   Patient Active Problem List   Diagnosis Date Noted  . Schizoaffective disorder, bipolar type (HCC) [F25.0] 03/08/2015     Continued Clinical Symptoms:  Alcohol Use Disorder Identification Test Final Score (AUDIT): 1 The "Alcohol Use Disorders Identification Test", Guidelines for Use in Primary Care, Second Edition.  World Science writerHealth Organization [facility](WHO). Score between 0-7:  no or low risk or alcohol related problems. Score between 8-15:  moderate risk of alcohol related problems. Score between 16-19:  high risk of alcohol related problems. Score 20 or above:  warrants further diagnostic evaluation for alcohol dependence and treatment.   CLINICAL FACTORS:   Bipolar Disorder:   Mixed State   Musculoskeletal: Strength & Muscle Tone: within normal limits Gait & Station: normal Patient leans: N/A  Psychiatric Specialty Exam: Physical Exam  Nursing note and vitals reviewed. Constitutional: She is oriented to person, place, and time. She appears well-developed and well-nourished.  HENT:  Head: Normocephalic and atraumatic.  Eyes: Conjunctivae and EOM are normal. Pupils are equal, round, and reactive to light.  Neck: Normal range of motion. Neck supple.  Cardiovascular: Normal rate, regular rhythm and normal heart sounds.   Respiratory: Effort normal and breath sounds normal.  GI: Soft. Bowel sounds are normal.  Musculoskeletal: Normal range of motion.  Neurological: She is alert and oriented to person, place, and time.  Skin: Skin is warm and dry.    Review of Systems  All other systems reviewed and are negative.   Blood pressure 143/86, pulse 69, temperature 98.7 F  (37.1 C), temperature source Oral, resp. rate 20, height 5\' 3"  (1.6 m), weight 79.833 kg (176 lb), SpO2 98 %.Body mass index is 31.18 kg/(m^2).  General Appearance: Fairly Groomed  Patent attorneyye Contact::  Good  Speech:  Clear and Coherent  Volume:  Normal  Mood:  Euphoric  Affect:  Congruent  Thought Process:  Goal Directed  Orientation:  Full (Time, Place, and Person)  Thought Content:  Hallucinations: Auditory  Suicidal Thoughts:  No  Homicidal Thoughts:  No  Memory:  Immediate;   Fair Recent;   Fair Remote;   Fair  Judgement:  Poor  Insight:  Shallow  Psychomotor Activity:  Normal  Concentration:  Fair  Recall:  FiservFair  Fund of Knowledge:Fair  Language: Fair  Akathisia:  No  Handed:  Right  AIMS (if indicated):     Assets:  Communication Skills Desire for Improvement Financial Resources/Insurance Physical Health Resilience Social Support  Sleep:  Number of Hours: 6  Cognition: WNL  ADL's:  Intact     COGNITIVE FEATURES THAT CONTRIBUTE TO RISK:  None    SUICIDE RISK:   Moderate:  Frequent suicidal ideation with limited intensity, and duration, some specificity in terms of plans, no associated intent, good self-control, limited dysphoria/symptomatology, some risk factors present, and identifiable protective factors, including available and accessible social support.  PLAN OF CARE: Hospital admission, medication management, substance abuse counseling, discharge planning.  Medical Decision Making:  New problem, with additional work up planned, Review of Psycho-Social Stressors (1), Review or order clinical lab tests (1), Review of Medication Regimen & Side Effects (2) and Review of New Medication or Change in Dosage (  2)   Jodi Evans is a 65 year old female with a history of bipolar illness admitted for psychotic manic episode.  1. Mood and psychosis. The patient was restarted on Prolixin. We will try to give Prolixin Decanoate injection to improve compliance. We will start  Tegretol for mood stabilization.   2. Hypertension. She is on Norvasc.  3. Insomnia. She is taking trazodone.  4. Disposition. She is uncertain if she may return to her son's place. She will follow up with [facility].    I certify that inpatient services furnished can reasonably be expected to improve the patient's condition.   Josee Speece 03/12/2015, 12:30 PM

## 2015-03-12 NOTE — BHH Counselor (Signed)
Adult Comprehensive Assessment  Patient ID: Jodi [name], female   DOB: December 18, 1950, 65 y.o.   MRN: [id number]  Information Source: Information source: Patient  Current Stressors:  Housing / Lack of housing: homeless  Living/Environment/Situation:  Living Arrangements:  (homeless but previously lived with son) How long has patient lived in current situation?: been homeless 6 days, lived with son 10 months  Family History:  Marital status: Divorced Divorced, when?: sept 2000 What types of issues is patient dealing with in the relationship?: "no contest" Are you sexually active?: No Does patient have children?: Yes How many children?: 2 How is patient's relationship with their children?: good relationship with both but son lives her and DTR lives in [place]  Childhood History:  By whom was/is the patient raised?: Mother Additional childhood history information: "dad was overseas in [place] so mom raised me" Description of patient's relationship with caregiver when they were a child: good relationship Patient's description of current relationship with people who raised him/her: not as much of a relationship as an adult because mom did not want me to marry the guy I married How were you disciplined when you got in trouble as a child/adolescent?: sit down and talk Does patient have siblings?: Yes Evans of Siblings: 4 Description of patient's current relationship with siblings: distance realtionship with all siblings Did patient suffer any verbal/emotional/physical/sexual abuse as a child?: No Did patient suffer from severe childhood neglect?: No Has patient ever been sexually abused/assaulted/raped as an adolescent or adult?: No Was the patient ever a victim of a crime or a disaster?: No Witnessed domestic violence?: No Has patient been effected by domestic violence as an adult?: No  Education:  Highest grade of school patient has completed: 18 years of school-PhD  Psychology Currently a student?: No Learning disability?: No  Employment/Work Situation:   Employment situation:  (retired) Where was the patient employed at that time?: [employer] Has patient ever been in the Eli Lilly and Companymilitary?: No Has patient ever served in Buyer, retailcombat?: No Did You Receive Any Psychiatric Treatment/Services While in Equities traderthe Military?: No Are There Guns or Education officer, communityther Weapons in Your Home?: No Are These ComptrollerWeapons Safely Secured?:  (n/a)  Financial Resources:   Surveyor, quantityinancial resources: Occidental Petroleumeceives SSI, Medicare Does patient have a Lawyerrepresentative payee or guardian?: No  Alcohol/Substance Abuse:   What has been your use of drugs/alcohol within the last 12 months?: denies Alcohol/Substance Abuse Treatment Hx: Denies past history  Social Support System:   Museum/gallery exhibitions officerDescribe Community Support System: independent, no support from family Type of faith/religion: Engineer, waterBaptist or Catholic-neither at the moment How does patient's faith help to cope with current illness?: prayer and read scripture  Leisure/Recreation:   Leisure and Hobbies: dance, read, listen to music  Strengths/Needs:   What things does the patient do well?: cooking  In what areas does patient struggle / problems for patient: getting along with others  Discharge Plan:   Does patient have access to transportation?: No Plan for no access to transportation at discharge: will need bus fare home if son cannot pick up Will patient be returning to same living situation after discharge?: Yes Plan for living situation after discharge: patient wants to seek independent hosuing Currently receiving community mental health services: Yes (From Whom) Vesta Mixer([facility]) Does patient have financial barriers related to discharge medications?: No  Summary/Recommendations:  Patient is a 65 yo African American female admitted for mania. Patient has been living with her son Jodi Evans and became ill and was dropped off at the emergency room with  all of  her belongings. Patient's son has not been returning this writer's phone calls after several attempts. Patient may be homeless and has information on [facility] shelter options and has been seen at [place] [phone] in the past. Patient reports she does not have disability but is retired and receives SSI and will be receiving her pension soon. 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 Patient was divorced in 2000 and has her son who has been supportive in [place] and a daughter in [place] but does not want to leave [place]. Patient will stabilize on medications and will discharge home with her son or to the shelter in [place] if her son will not allow her to return. Patient is encouraged to participate in medication management, group therapy and therapeutic milieu.     Jodi Evans., MSW, Jodi Evans [phone]  03/12/2015

## 2015-03-12 NOTE — Plan of Care (Signed)
Problem: Ineffective individual coping Goal: STG: Patient will remain free from self harm Outcome: Progressing Pt denies SI and remains free from self harm.     

## 2015-03-12 NOTE — BHH Group Notes (Signed)
B[facility] LCSW Aftercare Discharge Planning Group Note   03/12/2015 4:40 PM  Participation Quality:  Did not attend   Porter Nakama L Izick Gasbarro MSW, LCSWA  

## 2015-03-12 NOTE — BHH Group Notes (Signed)
B[facility] LCSW Group Therapy  03/12/2015 5:47 PM  Type of Therapy:  Group Therapy  Participation Level:  Minimal  Participation Quality:  Attentive  Affect:  Flat  Cognitive:  Alert  Insight:  Limited  Engagement in Therapy:  Limited  Modes of Intervention:  Discussion, Education, Socialization and Support  Summary of Progress/Problems:Emotional Regulation: Patients will identify both negative and positive emotions. They will discuss emotions they have difficulty regulating and how they impact their lives. Patients will be asked to identify healthy coping skills to combat unhealthy reactions to negative emotions.   Pt attended group and stayed the entire time. She sat quietly and listened to other group members share.   Sanjith Siwek L Ibeth Fahmy MSW, LCSWA  03/12/2015, 5:47 PM  

## 2015-03-12 NOTE — Progress Notes (Signed)
Pt has been pleasant and cooperative with care. No negative behaviors. Denies SI, Hi, AVH. Smiling talking appropriately. Med and group compliant. Encouragement and support provided, Pt receptive and remains safe on unit. Will continue to monitor and assess for safety, q 15 min checks

## 2015-03-12 NOTE — H&P (Signed)
Psychiatric Admission Assessment Adult  Patient Identification: Jodi Evans MRN:  [id number] Date of Evaluation:  03/12/2015 Chief Complaint:  Schizophrenic Principal Diagnosis: Schizoaffective disorder, bipolar type (HCC) Diagnosis:   Patient Active Problem List   Diagnosis Date Noted  . Schizoaffective disorder, bipolar type (HCC) [F25.0] 03/08/2015   History of Present Illness:  Identifying data. Jodi Evans is a 65 year old female with a history of bipolar disorder.  Chief complaint. "I had an argument with my son."  History of present illness. Information was obtained from the patient and the chart. Jodi Evans has a long history of bipolar illness with manic episodes every 7 years. Her last hospitalization was 9 years ago. She reports complete remission in between her episodes but claims to be taking all her medications as prescribed. She cannot name the prescribed medications. She reports that in the past week or so she started experiencing auditory hallucinations, she could not sleep, she felt restless and agitated, she was hyperreligious, impulsive and argumentative with her son. He reportedly packed her belongings and dropped her off at [facility] emergency room. The patient denies any symptoms of depression or anxiety. She reports that auditory hallucinations have been proving and she does not respond to them anymore. She denies use of alcohol or illicit substances.  Past psychiatric history. Since 1994 she has been admitted to the hospital 4 times every 7-9 years for manic episode. She's been tried on multiple medications including Risperdal, Seroquel, Prolixin. Prolixin reportedly works well. She does not remember taking mood stabilizers. She never attempted suicide.  Family psychiatric history. Sister with bipolar illness who committed suicide by overdose on drugs.  Social history. She lives with her son. She is retired. She will shortly get her pension and would like to  live independently then.   Total Time spent with patient: 1 hour  Past Psychiatric History:  Bipolar disorder.  Risk to Self: Is patient at risk for suicide?: No Risk to Others:   Prior Inpatient Therapy:   Prior Outpatient Therapy:    Alcohol Screening: 1. How often do you have a drink containing alcohol?: Monthly or less 2. How many drinks containing alcohol do you have on a typical day when you are drinking?: 1 or 2 3. How often do you have six or more drinks on one occasion?: Never Preliminary Score: 0 4. How often during the last year have you found that you were not able to stop drinking once you had started?: Never 5. How often during the last year have you failed to do what was normally expected from you becasue of drinking?: Never 6. How often during the last year have you needed a first drink in the morning to get yourself going after a heavy drinking session?: Never 7. How often during the last year have you had a feeling of guilt of remorse after drinking?: Never 8. How often during the last year have you been unable to remember what happened the night before because you had been drinking?: Never 9. Have you or someone else been injured as a result of your drinking?: No 10. Has a relative or friend or a doctor or another health worker been concerned about your drinking or suggested you cut down?: No Alcohol Use Disorder Identification Test Final Score (AUDIT): 1 Brief Intervention: AUDIT score less than 7 or less-screening does not suggest unhealthy drinking-brief intervention not indicated Substance Abuse History in the last 12 months:  No. Consequences of Substance Abuse: NA Previous Psychotropic Medications: Yes  Psychological  Evaluations: No  Past Medical History:  Past Medical History  Diagnosis Date  . Bipolar 1 disorder (HCC)   . Paranoid schizophrenia (HCC)   . Hypertension    History reviewed. No pertinent past surgical history. Family History: History  reviewed. No pertinent family history. Family Psychiatric  History:  Bipolar disorder and completed suicide. Social History:  History  Alcohol Use No     History  Drug Use No    Social History   Social History  . Marital Status: Single    Spouse Name: N/A  . Number of Children: N/A  . Years of Education: N/A   Social History Main Topics  . Smoking status: Former Games developermoker  . Smokeless tobacco: None  . Alcohol Use: No  . Drug Use: No  . Sexual Activity: Not Asked   Other Topics Concern  . None   Social History Narrative   Additional Social History:                         Allergies:   Allergies  Allergen Reactions  . Penicillins     Has patient had a PCN reaction causing immediate rash, facial/tongue/throat swelling, SOB or lightheadedness with hypotension:denied allergy Has patient had a PCN reaction causing severe rash involving mucus membranes or skin necrosis: denied allergy Has patient had a PCN reaction that required hospitalization denied allergy Has patient had a PCN reaction occurring within the last 10 years:denied allergy If all of the above answers are "NO", then may proceed with Cephalosporin use. Patient denies a  . Shellfish Allergy     Seafood-turns legs purple   Lab Results:  Results for orders placed or performed during the hospital encounter of 03/07/15 (from the past 48 hour(s))  Urinalysis, Routine w reflex microscopic (not at [facility])     Status: Abnormal   Collection Time: 03/10/15  5:18 PM  Result Value Ref Range   Color, Urine YELLOW YELLOW   APPearance CLOUDY (A) CLEAR   Specific Gravity, Urine 1.016 1.005 - 1.030   pH 6.0 5.0 - 8.0   Glucose, UA NEGATIVE NEGATIVE mg/dL   Hgb urine dipstick NEGATIVE NEGATIVE   Bilirubin Urine NEGATIVE NEGATIVE   Ketones, ur NEGATIVE NEGATIVE mg/dL   Protein, ur NEGATIVE NEGATIVE mg/dL   Nitrite NEGATIVE NEGATIVE   Leukocytes, UA MODERATE (A) NEGATIVE  Urine microscopic-add on     Status:  Abnormal   Collection Time: 03/10/15  5:18 PM  Result Value Ref Range   Squamous Epithelial / LPF 6-30 (A) NONE SEEN   WBC, UA 6-30 0 - 5 WBC/hpf   RBC / HPF 0-5 0 - 5 RBC/hpf   Bacteria, UA FEW (A) NONE SEEN    Metabolic Disorder Labs:  No results found for: HGBA1C, MPG No results found for: PROLACTIN No results found for: CHOL, TRIG, HDL, CHOLHDL, VLDL, LDLCALC  Current Medications: Current Facility-Administered Medications  Medication Dose Route Frequency Provider Last Rate Last Dose  . acetaminophen (TYLENOL) tablet 650 mg  650 mg Oral Q6H PRN Jimmy FootmanAndrea Hernandez-Gonzalez, MD      . alum & mag hydroxide-simeth (MAALOX/MYLANTA) 200-200-20 MG/5ML suspension 30 mL  30 mL Oral Q4H PRN Jimmy FootmanAndrea Hernandez-Gonzalez, MD      . amLODipine (NORVASC) tablet 5 mg  5 mg Oral Daily Jimmy FootmanAndrea Hernandez-Gonzalez, MD   5 mg at 03/12/15 1009  . benztropine (COGENTIN) tablet 1 mg  1 mg Oral BID Jimmy FootmanAndrea Hernandez-Gonzalez, MD   1 mg at 03/12/15 1009  .  fluPHENAZine (PROLIXIN) tablet 5 mg  5 mg Oral BID Jimmy Footman, MD   5 mg at 03/12/15 1009  . fluPHENAZine decanoate (PROLIXIN) injection 50 mg  50 mg Intramuscular Q14 Days Hal Norrington B Sheli Dorin, MD   50 mg at 03/12/15 1009  . LORazepam (ATIVAN) tablet 1 mg  1 mg Oral Q6H PRN Jimmy Footman, MD       Or  . LORazepam (ATIVAN) injection 1 mg  1 mg Intramuscular Q6H PRN Jimmy Footman, MD      . magnesium hydroxide (MILK OF MAGNESIA) suspension 30 mL  30 mL Oral Daily PRN Jimmy Footman, MD      . traZODone (DESYREL) tablet 100 mg  100 mg Oral QHS Jimmy Footman, MD   100 mg at 03/12/15 0444   PTA Medications: Prescriptions prior to admission  Medication Sig Dispense Refill Last Dose  . amLODipine (NORVASC) 5 MG tablet Take 1 tablet (5 mg total) by mouth daily. (Patient not taking: Reported on 03/07/2015) 30 tablet 0   . benztropine (COGENTIN) 0.5 MG tablet Take 0.5 mg by mouth daily.   0 03/07/2015 at Unknown  time    Musculoskeletal: Strength & Muscle Tone: within normal limits Gait & Station: normal Patient leans: N/A  Psychiatric Specialty Exam: Physical Exam  Nursing note and vitals reviewed.   Review of Systems  Musculoskeletal: Positive for joint pain.  All other systems reviewed and are negative.   Blood pressure 143/86, pulse 69, temperature 98.7 F (37.1 C), temperature source Oral, resp. rate 20, height 5\' 3"  (1.6 m), weight 79.833 kg (176 lb), SpO2 98 %.Body mass index is 31.18 kg/(m^2).  See SRA.                                                  Sleep:  Number of Hours: 6     Treatment Plan Summary: Daily contact with patient to assess and evaluate symptoms and progress in treatment and Medication management   Jodi Evans is a 65 year old female with a history of bipolar illness admitted for psychotic manic episode.  1. Mood and psychosis. The patient was restarted on Prolixin. We will try to give Prolixin Decanoate injection to improve compliance. We will start Tegretol for mood stabilization.   2. Hypertension. She is on Norvasc.  3. Insomnia. She is taking trazodone.  4. UTI. She was given a dose of fosfomycin.  5. Disposition. She is uncertain if she may return to her son's place. She will follow up with [facility].  Observation Level/Precautions:  15 minute checks  Laboratory:  CBC Chemistry Profile UDS UA  Psychotherapy:    Medications:    Consultations:    Discharge Concerns:    Estimated LOS:  Other:     I certify that inpatient services furnished can reasonably be expected to improve the patient's condition.   Marshay Slates 1/11/201712:36 PM

## 2015-03-12 NOTE — Plan of Care (Signed)
Problem: Ineffective individual coping Goal: STG: Patient will remain free from self harm Outcome: Progressing No self harm reported or observed     

## 2015-03-12 NOTE — Progress Notes (Signed)
Recreation Therapy Notes  Date: 01.11.17 Time: 3:00 pm Location: Craft Room  Group Topic: Self-esteem  Goal Area(s) Addresses:  Patient will write at least one positive trait. Patient will verbalize benefit of having a healthy self-esteem.  Behavioral Response: Attentive, Left early  Intervention: I Am  Activity: Patients were given a worksheet with the letter I and instructed to write as many positive traits inside the letter as they could.  Education: LRT educated patients on ways they can increase their self-esteem.  Education Outcome: Patient left before LRT educated group.  Clinical Observations/Feedback: Patient wrote positive traits. Patient did not contribute to group discussion. Patient left group at approximately 3:35 pm. Patient did not return to group.  Jacquelynn CreeGreene,Jaasiel Hollyfield M, LRT/CTRS 03/12/2015 4:56 PM

## 2015-03-12 NOTE — Progress Notes (Signed)
D: Observed pt active on the milieu. Patient alert and oriented x4. Patient denies SI/HI/. Pt endorsed AH of voices that "were talking to themselves. Pt endorsed having a history of hearing voices. Pt affect is labile, but was pleasant and cooperative for Clinical research associatewriter. Pt described previous medication that she took prior to hospitalization as making her stiff, faint and "making me fee like a Gorilla..it controlled me." Pt wants to go back and live with her son for a short period of time, and then transition to living by self. Pt refused Trazodone.  A: Offered active listening and support. Provided therapeutic communication. Administered scheduled medications. Discussed reason for not wanting to take Trazodone. Encourage pt to continue attending groups and participating in care. R: Pt stated "I slept well last night, and don't think I need it" in regards to Trazodone. Pt pleasant and cooperative. Pt med compliant. Will continue Q15 min. Checks. Safety maintained.

## 2015-03-12 NOTE — [facility] Group Notes (Signed)
[facility] Group Notes:  (Nursing/MHT/Case Management/Adjunct)  Date:  03/12/2015  Time:  10:57 PM  Type of Therapy:  Evening Wrap-up Group  Participation Level:  Did Not Attend  Participation Quality:  N/A  Affect:  N/A  Cognitive:  N/A  Insight:  None  Engagement in Group:  N/A  Modes of Intervention:  Discussion  Summary of Progress/Problems:  Tomasita MorrowChelsea Nanta Jareb Radoncic 03/12/2015, 10:57 PM

## 2015-03-12 NOTE — Progress Notes (Signed)
Recreation Therapy Notes  INPATIENT RECREATION THERAPY ASSESSMENT  Patient Details Name: Jodi [name] MRN: [id number] DOB: November 22, 1950 Today's Date: 03/12/2015  Patient Stressors: Other (Comment) (Homeless)  Coping Skills:   Exercise, Art/Dance, Music, Sports  Personal Challenges:  Patient reported no personal challenges.  Leisure Interests (2+):  Individual - Other (Comment) (Sing, dance)  Awareness of Community Resources:  No  Community Resources:     Current Use:    If no, Barriers?:    Patient Strengths:  Everything  Patient Identified Areas of Improvement:  Not at the moment  Current Recreation Participation:  Go out to different places and hang out with friends  Patient Goal for Hospitalization:  To find a place to live  [place]  [place] Residence:  [place]   Current SI (including self-harm):  No  Current HI:  No  Consent to Intern Participation: N/A  Due to patient reporting no personal challenges, LRT will not develop a Recreational Therapy Care Plan at this time. If patient's status changes, LRT will develop a Recreational Therapy Care Plan.  Jacquelynn CreeGreene,Lorance Pickeral M, LRT/CTRS 03/12/2015, 5:45 PM

## 2015-03-13 LAB — HEMOGLOBIN A1C: HEMOGLOBIN A1C: 5.5 % (ref 4.0–6.0)

## 2015-03-13 MED ORDER — CARBAMAZEPINE 200 MG PO TABS
200.0000 mg | ORAL_TABLET | Freq: Two times a day (BID) | ORAL | Status: DC
  Administered 2015-03-13 – 2015-03-18 (×10): 200 mg via ORAL
  Filled 2015-03-13 (×10): qty 1

## 2015-03-13 NOTE — Progress Notes (Signed)
[facility]  03/13/2015 11:16 AM Rich [name]  MRN:  [id number]  Subjective:  Ms. Jodi Pianonderson is pleasant, polite, and engaging. She denies any symptoms of depression, anxiety, or psychosis. She however was observed talking to herself in her room. She also extremely paranoid and unwilling to disclose any details about her situation. She was started on Prolixin and yesterday she accepted injection of Prolixin Decanoate. She has no somatic complaints. She does participate in programming but again is rather cachectic and paranoid. We were trying to get in touch with her son but were unable to do so. The patient is homeless. She claims to have a PhD in psychology.  Principal Problem: Schizoaffective disorder, bipolar type (HCC) Diagnosis:   Patient Active Problem List   Diagnosis Date Noted  . UTI (urinary tract infection) [N39.0] 03/12/2015  . Schizoaffective disorder, bipolar type (HCC) [F25.0] 03/08/2015   Total Time spent with patient: 20 minutes  Past Psychiatric History: Bipolar disorder.  Past Medical History:  Past Medical History  Diagnosis Date  . Bipolar 1 disorder (HCC)   . Paranoid schizophrenia (HCC)   . Hypertension    History reviewed. No pertinent past surgical history. Family History: History reviewed. No pertinent family history. Family Psychiatric  History: Sister with bipolar and suicided. Social History:  History  Alcohol Use No     History  Drug Use No    Social History   Social History  . Marital Status: Single    Spouse Name: N/A  . Number of Children: N/A  . Years of Education: N/A   Social History Main Topics  . Smoking status: Former Games developermoker  . Smokeless tobacco: None  . Alcohol Use: No  . Drug Use: No  . Sexual Activity: Not Asked   Other Topics Concern  . None   Social History Narrative   Additional Social History:                         Sleep: Fair  Appetite:  Fair  Current Medications: Current  Facility-Administered Medications  Medication Dose Route Frequency Provider Last Rate Last Dose  . acetaminophen (TYLENOL) tablet 650 mg  650 mg Oral Q6H PRN Jimmy FootmanAndrea Hernandez-Gonzalez, MD   650 mg at 03/13/15 0844  . alum & mag hydroxide-simeth (MAALOX/MYLANTA) 200-200-20 MG/5ML suspension 30 mL  30 mL Oral Q4H PRN Jimmy FootmanAndrea Hernandez-Gonzalez, MD      . amLODipine (NORVASC) tablet 5 mg  5 mg Oral Daily Jimmy FootmanAndrea Hernandez-Gonzalez, MD   5 mg at 03/13/15 0844  . benztropine (COGENTIN) tablet 1 mg  1 mg Oral BID Jimmy FootmanAndrea Hernandez-Gonzalez, MD   1 mg at 03/13/15 0844  . fluPHENAZine (PROLIXIN) tablet 5 mg  5 mg Oral BID Jimmy FootmanAndrea Hernandez-Gonzalez, MD   5 mg at 03/13/15 0844  . fluPHENAZine decanoate (PROLIXIN) injection 50 mg  50 mg Intramuscular Q14 Days Jessy Cybulski B Aaralynn Shepheard, MD   50 mg at 03/12/15 1009  . LORazepam (ATIVAN) tablet 1 mg  1 mg Oral Q6H PRN Jimmy FootmanAndrea Hernandez-Gonzalez, MD       Or  . LORazepam (ATIVAN) injection 1 mg  1 mg Intramuscular Q6H PRN Jimmy FootmanAndrea Hernandez-Gonzalez, MD      . magnesium hydroxide (MILK OF MAGNESIA) suspension 30 mL  30 mL Oral Daily PRN Jimmy FootmanAndrea Hernandez-Gonzalez, MD      . traZODone (DESYREL) tablet 100 mg  100 mg Oral QHS Jimmy FootmanAndrea Hernandez-Gonzalez, MD   100 mg at 03/12/15 2149    Lab Results:  Results for orders placed or performed during the hospital encounter of 03/11/15 (from the past 48 hour(s))  Hemoglobin A1c     Status: None   Collection Time: 03/12/15  4:17 PM  Result Value Ref Range   Hgb A1c MFr Bld 5.5 4.0 - 6.0 %  Lipid panel, fasting     Status: Abnormal   Collection Time: 03/12/15  4:17 PM  Result Value Ref Range   Cholesterol 179 0 - 200 mg/dL   Triglycerides 99 <960 mg/dL   HDL 58 >45 mg/dL   Total CHOL/HDL Ratio 3.1 RATIO   VLDL 20 0 - 40 mg/dL   LDL Cholesterol 409 (H) 0 - 99 mg/dL    Comment:        Total Cholesterol/HDL:CHD Risk Coronary Heart Disease Risk Table                     Men   Women  1/2 Average Risk   3.4   3.3  Average Risk        5.0   4.4  2 X Average Risk   9.6   7.1  3 X Average Risk  23.4   11.0        Use the calculated Patient Ratio above and the CHD Risk Table to determine the patient's CHD Risk.        ATP III CLASSIFICATION (LDL):  <100     mg/dL   Optimal  811-914  mg/dL   Near or Above                    Optimal  130-159  mg/dL   Borderline  782-956  mg/dL   High  >213     mg/dL   Very High   TSH     Status: None   Collection Time: 03/12/15  4:17 PM  Result Value Ref Range   TSH 1.376 0.350 - 4.500 uIU/mL    Physical Findings: AIMS:  , ,  ,  ,    CIWA:    COWS:     Musculoskeletal: Strength & Muscle Tone: within normal limits Gait & Station: normal Patient leans: N/A  Psychiatric Specialty Exam: Review of Systems  All other systems reviewed and are negative.   Blood pressure 117/66, pulse 59, temperature 98.7 F (37.1 C), temperature source Oral, resp. rate 20, height 5\' 3"  (1.6 m), weight 79.833 kg (176 lb), SpO2 98 %.Body mass index is 31.18 kg/(m^2).  General Appearance: Casual  Eye Contact::  Good  Speech:  Clear and Coherent  Volume:  Normal  Mood:  Euphoric  Affect:  Congruent  Thought Process:  Goal Directed  Orientation:  Full (Time, Place, and Person)  Thought Content:  Delusions, Hallucinations: Auditory and Paranoid Ideation  Suicidal Thoughts:  No  Homicidal Thoughts:  No  Memory:  Immediate;   Fair Recent;   Fair Remote;   Fair  Judgement:  Impaired  Insight:  Lacking  Psychomotor Activity:  Normal  Concentration:  Fair  Recall:  [employer] of Knowledge:Fair  Language: Fair  Akathisia:  No  Handed:  Right  AIMS (if indicated):     Assets:  Communication Skills Desire for Improvement Financial Resources/Insurance Physical Health Resilience  ADL's:  Intact  Cognition: WNL  Sleep:  Number of Hours: 7   Treatment Plan Summary: Daily contact with patient to assess and evaluate symptoms and progress in treatment and Medication management   Mrs.  Jodi Evans is a  64 year old female with a history of bipolar illness admitted for psychotic manic episode.  1. Mood and psychosis. The patient was restarted on Prolixin. We gave 50 mg of Prolixin Decanoate injection to improve compliance. We start Tegretol for mood stabilization.   2. Hypertension. She is on Norvasc.  3. Insomnia. She is taking trazodone.  4. UTI. She was given a dose of fosfomycin.  5. Disposition. She is uncertain if she may return to her son's place. She will follow up with [facility].  Meron Bocchino 03/13/2015, 11:16 AM

## 2015-03-13 NOTE — Progress Notes (Signed)
Pt pleasant, c/o headache, prn given with good relief. Denies SI, HI, endorses AVH. Med and group compliant. Encouragement and support offered. Pt receptive, will continue to assess and monitor for safety

## 2015-03-13 NOTE — [facility] Group Notes (Signed)
[facility] Group Notes:  (Nursing/MHT/Case Management/Adjunct)  Date:  03/13/2015  Time:  9:16 AM  Type of Therapy:  Community Meeting   Participation Level:  Did Not Attend  Jodi Evans 03/13/2015, 9:16 AM

## 2015-03-13 NOTE — [facility] Suicide Risk Assessment (Signed)
[facility] INPATIENT:  Family/Significant Other Suicide Prevention Education  Suicide Prevention Education:  Contact Attempts: Tillman SersRobert Skolnick (son) [phone] has been identified by the patient as the family member/significant other with whom the patient will be residing, and identified as the person(s) who will aid the patient in the event of a mental health crisis.  With written consent from the patient, two attempts were made to provide suicide prevention education, prior to and/or following the patient's discharge.  We were unsuccessful in providing suicide prevention education.  A suicide education pamphlet was given to the patient to share with family/significant other.  Date and time of first attempt:03/12/15 5:06pm Date and time of second attempt:03/13/15 9:54am  Lulu Ridingngle, Raheem Kolbe T, MSW, Theresia MajorsLCSWA [phone] 03/13/2015, 2:22 PM

## 2015-03-13 NOTE — Tx Team (Signed)
Interdisciplinary Treatment Plan Update (Adult)  Date:  03/13/2015 Time Reviewed:  2:10 PM  Progress in Treatment: Attending groups: No. Participating in groups:  No. Taking medication as prescribed:  Yes. Tolerating medication:  Yes. Family/Significant othe contact made:  No, will contact:  patient's son Patient understands diagnosis:  Yes. Discussing patient identified problems/goals with staff:  Yes. Medical problems stabilized or resolved:  Yes. Denies suicidal/homicidal ideation: Yes. Issues/concerns per patient self-inventory:  No. Other:  New problem(s) identified: Yes, Describe:  patient is currently homeless and if she cannot return with family wants to go to [facility] shelter  Discharge Plan or Barriers: Patient will stabilize on medications and discharge to a shelter in [place] if she cannot return with family. Patient will need outpatient follow up in [facility] and has been to [facility] in the past.  Reason for Continuation of Hospitalization: Mania Medication stabilization  Comments:  Estimated length of stay:up to 4 days expected discharge Monday 03/17/15  New goal(s):  Review of initial/current patient goals per problem list:   1.  Goal(s):participate in care planning  Met:  No  Target date:by discharge  As evidenced [id number] participation in aftercare planning  2.  Goal (s):decrease mania  Met:  No  Target date:by discharge  As evidenced [id number] decreased signs of mania   Attendees: Physician:  Orson Slick, MD 1/12/20172:10 PM  Nursing:   Floyde Parkins, RN 1/12/20172:10 PM  Other:  Carmell Austria, [place] 1/12/20172:10 PM  Other:   1/12/20172:10 PM  Other:   1/12/20172:10 PM  Other:  1/12/20172:10 PM  Other:  1/12/20172:10 PM  Other:  1/12/20172:10 PM  Other:  1/12/20172:10 PM  Other:  1/12/20172:10 PM  Other:  1/12/20172:10 PM  Other:   1/12/20172:10 PM   Scribe for Treatment Team:   Keene Breath, MSW, LCSWA  [phone]  03/13/2015, 2:10 PM

## 2015-03-13 NOTE — [facility] Group Notes (Signed)
[facility] Group Notes:  (Nursing/MHT/Case Management/Adjunct)  Date:  03/13/2015  Time:  2:23 PM  Type of Therapy:  Psychoeducational Skills  Participation Level:  Did Not Attend  P  Jodi Evans 03/13/2015, 2:23 PM

## 2015-03-13 NOTE — Progress Notes (Signed)
Recreation Therapy Notes  Date: 01.12.17 Time: 3:00 pm Location: Craft Room  Group Topic: Leisure Education/ Coping Skills  Goal Area(s) Addresses:  Patient will identify things they are grateful for. Patient will identify how being grateful can influence decision making.  Behavioral Response: Did not attend  Intervention: Grateful Wheel  Activity: Patients were given an "I Am Grateful For" worksheet and instructed to write at least one thing they are grateful for under each category.  Education: LRT educated patients on leisure and why it is important.  Education Outcome: Patient did not attend group.   Clinical Observations/Feedback: Patient did not attend group.  Jacquelynn CreeGreene,Jontrell Bushong M, LRT/CTRS 03/13/2015 4:12 PM

## 2015-03-13 NOTE — Plan of Care (Signed)
Problem: Ineffective individual coping Goal: STG: Pt will be able to identify effective and ineffective STG: Pt will be able to identify effective and ineffective coping patterns  Outcome: Not Met (add Reason) Patient isolates in room except for medications. Med compliant. S/e and adverse reactions discussed. Questions encourage. Clinical support provided. No PRNs given. q 15 min checks maintained. Pt remains free from harm.      

## 2015-03-13 NOTE — Plan of Care (Signed)
Problem: Ineffective individual coping Goal: LTG: Patient will report a decrease in negative feelings Outcome: Progressing Pt reports feeling better, denies hearing voices at present

## 2015-03-14 NOTE — Plan of Care (Signed)
Problem: Alteration in thought process Goal: LTG-Patient verbalizes understanding importance med regimen (Patient verbalizes understanding of importance of medication regimen and need to continue outpatient care.)  Outcome: Progressing Pt complying with medication regimen.

## 2015-03-14 NOTE — Progress Notes (Signed)
Patient stayed in room most of the time.Denies suicidal and homicidal ideation.Denies AV hallucination,states "I can answer the voices I can hear."Minimal interactions with peers.Did not attend groups.Compliant with medications.Appetite good.

## 2015-03-14 NOTE — [facility] Group Notes (Signed)
[facility] Group Notes:  (Nursing/MHT/Case Management/Adjunct)  Date:  03/14/2015  Time:  1:01 PM  Type of Therapy:  Movement Therapy  Participation Level:  Did Not Attend  Jodi Evans 03/14/2015, 1:01 PM 

## 2015-03-14 NOTE — [facility] Group Notes (Signed)
[facility] LCSW Aftercare Discharge Planning Group Note   03/14/2015 11:48 AM  Participation Quality: Did not attend.   Lenice Koper L Linville Decarolis MSW, LCSWA     

## 2015-03-14 NOTE — Plan of Care (Signed)
Problem: Alteration in thought process Goal: LTG-Patient verbalizes understanding importance med regimen (Patient verbalizes understanding of importance of medication regimen and need to continue outpatient care.)  Outcome: Progressing Compliant with medications.     

## 2015-03-14 NOTE — Progress Notes (Signed)
Recreation Therapy Notes  Date: 01.13.17 Time: 3:15 pm Location: Craft Room  Group Topic: Communication, Problem Solving, Teamwork  Goal Area(s) Addresses:  Patient will effectively work with peer towards shared goal. Patient will identify skills used to make activity successful. Patient will identify benefit of using group skills effectively post d/c.  Behavioral Response: Attentive, Interactive  Intervention: [employer] Tower  Activity: Patients were given 15 pipe cleaners and instructed to build a free standing tower. After about 6 minutes of them building, patients were instructed to put their dominant hand behind their backs.  Education: LRT educated patients on how they can use these skills outside of the hospital.  Education Outcome: In group clarification offered   Clinical Observations/Feedback: Patient completed activity by working with peers to build tower. Patient used communication, problem solving, and teamwork. Patient contributed to group discussion by stating that her team worked together effectively.  Jacquelynn CreeGreene,Evarose Altland M, LRT/CTRS 03/14/2015 4:26 PM

## 2015-03-14 NOTE — Progress Notes (Signed)
[facility] MD Progress Note  03/15/2015 1:49 PM Rich NumberSusie Flett  MRN:  [id number]  Subjective:  [facility] Dareen Pianonderson has a history of schizoaffective disorder. She was admitted floridly psychotic in the context of medication noncompliance. Apparently her son kicked her out of the house. We were unable to get in touch with him yet. Disposition is unclear.  [name] has no complaints today but again was observed talking to herself. When asked about her voices she told me that she talks Satan. She is poorly groomed, in bed most of the time, has difficulty participating in programming. She has no somatic complaints. She accepts medications including Prolixin decanoate injection and seems to tolerate them well.  Today she was in her room. She told me she did not have any issues or concerns today. Denied having any auditory or visual hallucinations. Denied having depression, SI or HI. She denied having side effects from her medications and denied having any physical complaints. Patient stated that she was sleeping very well but per nursing notes she only slept 3 hours .  Compliance: 100% Oral intake: 100% Sleep: Only 3 hours  Per nursing: D: Observed pt in room lying down awake. Patient alert and oriented x4. Patient denies SI/HI/AVH. Pt affect is flat. Pt discussed going to group and enjoying a group session about a pyramid. Pt denies depression, anxiety, and paranoia. Pt stated she is waiting to hear back from son to get discharged.  A: Offered active listening and support. Provided therapeutic communication. Administered scheduled medications.  R: Pt pleasant and cooperative. Pt medication compliant. Will continue Q15 min. Checks. Safety maintained.  Principal Problem: Schizoaffective disorder, bipolar type (HCC) Diagnosis:   Patient Active Problem List   Diagnosis Date Noted  . UTI (urinary tract infection) [N39.0] 03/12/2015  . Schizoaffective disorder, bipolar type (HCC) [F25.0] 03/08/2015   Total  Time spent with patient: 20 minutes  Past Psychiatric History: Bipolar disorder.  Past Medical History:  Past Medical History  Diagnosis Date  . Bipolar 1 disorder (HCC)   . Paranoid schizophrenia (HCC)   . Hypertension    History reviewed. No pertinent past surgical history. Family History: History reviewed. No pertinent family history. Family Psychiatric  History: None reported. Social History:  History  Alcohol Use No     History  Drug Use No    Social History   Social History  . Marital Status: Single    Spouse Name: N/A  . Number of Children: N/A  . Years of Education: N/A   Social History Main Topics  . Smoking status: Former Games developermoker  . Smokeless tobacco: None  . Alcohol Use: No  . Drug Use: No  . Sexual Activity: Not Asked   Other Topics Concern  . None   Social History Narrative    Sleep: Poor  Appetite:  Good  Current Medications: Current Facility-Administered Medications  Medication Dose Route Frequency Provider Last Rate Last Dose  . acetaminophen (TYLENOL) tablet 650 mg  650 mg Oral Q6H PRN Jimmy FootmanAndrea Hernandez-Gonzalez, MD   650 mg at 03/13/15 0844  . alum & mag hydroxide-simeth (MAALOX/MYLANTA) 200-200-20 MG/5ML suspension 30 mL  30 mL Oral Q4H PRN Jimmy FootmanAndrea Hernandez-Gonzalez, MD      . amLODipine (NORVASC) tablet 5 mg  5 mg Oral Daily Jimmy FootmanAndrea Hernandez-Gonzalez, MD   5 mg at 03/15/15 0820  . benztropine (COGENTIN) tablet 1 mg  1 mg Oral BID Jimmy FootmanAndrea Hernandez-Gonzalez, MD   1 mg at 03/15/15 0820  . carbamazepine (TEGRETOL) tablet 200 mg  200  mg Oral BID AC & HS Jolanta B Pucilowska, MD   200 mg at 03/15/15 0819  . fluPHENAZine (PROLIXIN) tablet 5 mg  5 mg Oral BID Jimmy Footman, MD   5 mg at 03/15/15 0820  . fluPHENAZine decanoate (PROLIXIN) injection 50 mg  50 mg Intramuscular Q14 Days Jolanta B Pucilowska, MD   50 mg at 03/12/15 1009  . LORazepam (ATIVAN) tablet 1 mg  1 mg Oral Q6H PRN Jimmy Footman, MD       Or  . LORazepam  (ATIVAN) injection 1 mg  1 mg Intramuscular Q6H PRN Jimmy Footman, MD      . magnesium hydroxide (MILK OF MAGNESIA) suspension 30 mL  30 mL Oral Daily PRN Jimmy Footman, MD      . traZODone (DESYREL) tablet 100 mg  100 mg Oral QHS Jimmy Footman, MD   100 mg at 03/14/15 2222    Lab Results:  No results found for this or any previous visit (from the past 48 hour(s)).  Physical Findings: AIMS:  , ,  ,  ,    CIWA:    COWS:     Musculoskeletal: Strength & Muscle Tone: within normal limits Gait & Station: normal Patient leans: N/A  Psychiatric Specialty Exam: Review of Systems  Constitutional: Negative.   HENT: Negative.   Eyes: Negative.   Respiratory: Negative.   Cardiovascular: Negative.   Gastrointestinal: Negative.   Genitourinary: Negative.   Musculoskeletal: Negative.   Skin: Negative.   Neurological: Negative.   Endo/Heme/Allergies: Negative.   Psychiatric/Behavioral: Positive for hallucinations.  All other systems reviewed and are negative.   Blood pressure 127/83, pulse 79, temperature 98.2 F (36.8 C), temperature source Oral, resp. rate 20, height 5\' 3"  (1.6 m), weight 79.833 kg (176 lb), SpO2 98 %.Body mass index is 31.18 kg/(m^2).  General Appearance: Disheveled  Eye Contact::  Minimal  Speech:  Clear and Coherent  Volume:  Normal  Mood:  Dysphoric  Affect:  Congruent  Thought Process:  Goal Directed  Orientation:  Full (Time, Place, and Person)  Thought Content:  Hallucinations: Auditory  Suicidal Thoughts:  No  Homicidal Thoughts:  No  Memory:  Immediate;   Fair Recent;   Fair Remote;   Fair  Judgement:  Poor  Insight:  Lacking  Psychomotor Activity:  Decreased  Concentration:  Fair  Recall:  [employer] of Knowledge:Fair  Language: Fair  Akathisia:  No  Handed:  Right  AIMS (if indicated):     Assets:  Communication Skills Desire for Improvement Financial Resources/Insurance Physical Health Resilience   ADL's:  Intact  Cognition: WNL  Sleep:  Number of Hours: 3.25   Treatment Plan Summary: Daily contact with patient to assess and evaluate symptoms and progress in treatment and Medication management   Mrs. Ribaudo is a 65 year old female with a history of bipolar illness admitted for psychotic manic episode.  1. Mood and psychosis. The patient was restarted on Prolixin. We gave 50 mg of Prolixin Decanoate injection to improve compliance. We started Tegretol for mood stabilization.   2. Hypertension. She is on Norvasc.  3. Insomnia: Patient only slept 3 hours last night I will increase trazodone to 150 mg by mouth daily at bedtime  4. UTI. She was given a dose of fosfomycin.  5. Disposition. To be established.  Jimmy Footman 03/15/2015, 1:49 PM

## 2015-03-14 NOTE — Progress Notes (Signed)
[facility] MD Progress Note  03/14/2015 1:26 PM Jodi Evans  MRN:  [id number]  Subjective:  Jodi Evans has a history of schizoaffective disorder. She was admitted floridly psychotic in the context of medication noncompliance. Apparently her son kicked her out of the house. We were unable to get in touch with him yet. Disposition is unclear.  Jodi Evans has no complaints today but again was observed talking to herself. When asked about her voices she told me that she talks Satan. She is poorly groomed, in bed most of the time, has difficulty participating in programming. She has no somatic complaints. She accepts medications including Prolixin decanoate injection and seems to tolerate them well.  Principal Problem: Schizoaffective disorder, bipolar type (HCC) Diagnosis:   Patient Active Problem List   Diagnosis Date Noted  . UTI (urinary tract infection) [N39.0] 03/12/2015  . Schizoaffective disorder, bipolar type (HCC) [F25.0] 03/08/2015   Total Time spent with patient: 20 minutes  Past Psychiatric History: Bipolar disorder.  Past Medical History:  Past Medical History  Diagnosis Date  . Bipolar 1 disorder (HCC)   . Paranoid schizophrenia (HCC)   . Hypertension    History reviewed. No pertinent past surgical history. Family History: History reviewed. No pertinent family history. Family Psychiatric  History: None reported. Social History:  History  Alcohol Use No     History  Drug Use No    Social History   Social History  . Marital Status: Single    Spouse Name: N/A  . Number of Children: N/A  . Years of Education: N/A   Social History Main Topics  . Smoking status: Former Games developer  . Smokeless tobacco: None  . Alcohol Use: No  . Drug Use: No  . Sexual Activity: Not Asked   Other Topics Concern  . None   Social History Narrative   Additional Social History:                         Sleep: Fair  Appetite:  Fair  Current Medications: Current  Facility-Administered Medications  Medication Dose Route Frequency Provider Last Rate Last Dose  . acetaminophen (TYLENOL) tablet 650 mg  650 mg Oral Q6H PRN Jimmy Footman, MD   650 mg at 03/13/15 0844  . alum & mag hydroxide-simeth (MAALOX/MYLANTA) 200-200-20 MG/5ML suspension 30 mL  30 mL Oral Q4H PRN Jimmy Footman, MD      . amLODipine (NORVASC) tablet 5 mg  5 mg Oral Daily Jimmy Footman, MD   5 mg at 03/14/15 0856  . benztropine (COGENTIN) tablet 1 mg  1 mg Oral BID Jimmy Footman, MD   1 mg at 03/14/15 0856  . carbamazepine (TEGRETOL) tablet 200 mg  200 mg Oral BID AC & HS Jolanta B Pucilowska, MD   200 mg at 03/14/15 0856  . fluPHENAZine (PROLIXIN) tablet 5 mg  5 mg Oral BID Jimmy Footman, MD   5 mg at 03/14/15 0856  . fluPHENAZine decanoate (PROLIXIN) injection 50 mg  50 mg Intramuscular Q14 Days Jolanta B Pucilowska, MD   50 mg at 03/12/15 1009  . LORazepam (ATIVAN) tablet 1 mg  1 mg Oral Q6H PRN Jimmy Footman, MD       Or  . LORazepam (ATIVAN) injection 1 mg  1 mg Intramuscular Q6H PRN Jimmy Footman, MD      . magnesium hydroxide (MILK OF MAGNESIA) suspension 30 mL  30 mL Oral Daily PRN Jimmy Footman, MD      .  traZODone (DESYREL) tablet 100 mg  100 mg Oral QHS Jimmy Footman, MD   100 mg at 03/13/15 2105    Lab Results:  Results for orders placed or performed during the hospital encounter of 03/11/15 (from the past 48 hour(s))  Hemoglobin A1c     Status: None   Collection Time: 03/12/15  4:17 PM  Result Value Ref Range   Hgb A1c MFr Bld 5.5 4.0 - 6.0 %  Lipid panel, fasting     Status: Abnormal   Collection Time: 03/12/15  4:17 PM  Result Value Ref Range   Cholesterol 179 0 - 200 mg/dL   Triglycerides 99 <161 mg/dL   HDL 58 >09 mg/dL   Total CHOL/HDL Ratio 3.1 RATIO   VLDL 20 0 - 40 mg/dL   LDL Cholesterol 604 (H) 0 - 99 mg/dL    Comment:        Total Cholesterol/HDL:CHD  Risk Coronary Heart Disease Risk Table                     Men   Women  1/2 Average Risk   3.4   3.3  Average Risk       5.0   4.4  2 X Average Risk   9.6   7.1  3 X Average Risk  23.4   11.0        Use the calculated Patient Ratio above and the CHD Risk Table to determine the patient's CHD Risk.        ATP III CLASSIFICATION (LDL):  <100     mg/dL   Optimal  540-981  mg/dL   Near or Above                    Optimal  130-159  mg/dL   Borderline  191-478  mg/dL   High  >295     mg/dL   Very High   TSH     Status: None   Collection Time: 03/12/15  4:17 PM  Result Value Ref Range   TSH 1.376 0.350 - 4.500 uIU/mL    Physical Findings: AIMS:  , ,  ,  ,    CIWA:    COWS:     Musculoskeletal: Strength & Muscle Tone: within normal limits Gait & Station: normal Patient leans: N/A  Psychiatric Specialty Exam: Review of Systems  Psychiatric/Behavioral: Positive for hallucinations.  All other systems reviewed and are negative.   Blood pressure 108/89, pulse 91, temperature 98.7 F (37.1 C), temperature source Oral, resp. rate 20, height 5\' 3"  (1.6 m), weight 79.833 kg (176 lb), SpO2 98 %.Body mass index is 31.18 kg/(m^2).  General Appearance: Disheveled  Eye Contact::  Minimal  Speech:  Clear and Coherent  Volume:  Normal  Mood:  Dysphoric  Affect:  Congruent  Thought Process:  Goal Directed  Orientation:  Full (Time, Place, and Person)  Thought Content:  Hallucinations: Auditory  Suicidal Thoughts:  No  Homicidal Thoughts:  No  Memory:  Immediate;   Fair Recent;   Fair Remote;   Fair  Judgement:  Poor  Insight:  Lacking  Psychomotor Activity:  Decreased  Concentration:  Fair  Recall:  [employer] of Knowledge:Fair  Language: Fair  Akathisia:  No  Handed:  Right  AIMS (if indicated):     Assets:  Communication Skills Desire for Improvement Financial Resources/Insurance Physical Health Resilience  ADL's:  Intact  Cognition: WNL  Sleep:  Number of Hours: 6  Treatment Plan Summary: Daily contact with patient to assess and evaluate symptoms and progress in treatment and Medication management   Jodi Evans is a 65 year old female with a history of bipolar illness admitted for psychotic manic episode.  1. Mood and psychosis. The patient was restarted on Prolixin. We gave 50 mg of Prolixin Decanoate injection to improve compliance. We started Tegretol for mood stabilization.   2. Hypertension. She is on Norvasc.  3. Insomnia. She is taking trazodone.  4. UTI. She was given a dose of fosfomycin.  5. Disposition. To be established.  Jolanta Pucilowska 03/14/2015, 1:26 PM

## 2015-03-14 NOTE — Progress Notes (Signed)
D: Observed pt in room sitting down on bed. Patient alert and oriented x4. Patient denies SI/HI/AVH. Pt affect is flat. Pt states she has been attending groups but "not sure if I learned anything." Pt became irritable during assessment stating "I'm tired of people coming in here and asking me questions." Pt discussed wanting to move back home with son, but only temporarily until she can live by herself. Pt reiterated  "I'm not delusional and I'm not hallucinating." A: Offered active listening and support. Provided therapeutic communication. Administered scheduled medications. Encouraged pt to continue medication regimen and treatment post discharge.  R: Pt remains cooperative. Pt compliant with medications. Will continue Q15 min. Checks. Safety maintained.

## 2015-03-15 MED ORDER — TRAZODONE HCL 50 MG PO TABS
150.0000 mg | ORAL_TABLET | Freq: Every day | ORAL | Status: DC
  Administered 2015-03-15 – 2015-03-17 (×3): 150 mg via ORAL
  Filled 2015-03-15 (×3): qty 1

## 2015-03-15 NOTE — Plan of Care (Signed)
Problem: Alteration in thought process Goal: STG-Patient is able to follow short directions Outcome: Progressing Pt able to follow directions by staff.

## 2015-03-15 NOTE — Progress Notes (Signed)
D:  Per pt self inventory pt reports sleeping good, appetite good, energy level normal, ability to pay attention good, rates depression at a 0 out of 10, hopelessness at a 0 out of 10, anxiety at a 0 out of 10, denies SI/HI/AVH, goal today: "socialize more, mingle", flat/depressed during interaction.   A:  Emotional support provided, Encouraged pt to continue with treatment plan and attend all group activities, q15 min checks maintained for safety.  R:  Pt is receptive, going to groups, pleasant and cooperative with staff and other patients on the unit, remains free from harm.

## 2015-03-15 NOTE — [facility] Group Notes (Signed)
[facility] Group Notes:  (Nursing/MHT/Case Management/Adjunct)  Date:  03/15/2015  Time:  10:44 AM  Type of Therapy:  Psychoeducational Skills  Participation Level:  Did Not Attend  Participation Quality:  n/a  Affect:  n/a  Cognitive:  n/a  Insight:  None  Engagement in Group:  n/a  Modes of Intervention:  n/a  Summary of Progress/Problems:  Jodi MunroeClarence Evans Jodi Evans 03/15/2015, 10:44 AM

## 2015-03-15 NOTE — Progress Notes (Signed)
D: Observed pt in room lying down awake. Patient alert and oriented x4. Patient denies SI/HI/AVH. Pt affect is flat. Pt discussed going to group and enjoying a group session about a pyramid. Pt denies depression, anxiety, and paranoia. Pt stated she is waiting to hear back from son to get discharged.  A: Offered active listening and support. Provided therapeutic communication. Administered scheduled medications.  R: Pt pleasant and cooperative. Pt medication compliant. Will continue Q15 min. Checks. Safety maintained.

## 2015-03-15 NOTE — [facility] Group Notes (Signed)
[facility] LCSW Group Therapy  03/15/2015 3:55 PM  Type of Therapy:  Group Therapy  Participation Level:  Minimal  Participation Quality:  Attentive  Affect:  Flat  Cognitive:  Alert  Insight:  Limited  Engagement in Therapy:  Limited  Modes of Intervention:  Discussion, Education, Socialization and Support  Summary of Progress/Problems: Pt will identify unhealthy thoughts and how they impact their emotions and behavior. Pt will be encouraged to discuss these thoughts, emotions and behaviors with the group. Pt attended group and stayed the entire time. She sat quietly and listened to other group members.   Daisy Floroandace L Deontrae Drinkard MSW, LCSWA  03/15/2015, 3:55 PM

## 2015-03-16 NOTE — Progress Notes (Signed)
Pt has been pleasant and cooperative. Pt denies SI and A/V hallucinations. Pt is able to contract for safety. Pt has been seclusive to her room. most of the day. Pt talked about the events leading to hospitalization. No inappropriate  behaviors noted this period.  

## 2015-03-16 NOTE — Progress Notes (Signed)
[facility]  03/16/2015 10:05 AM Rich [name]  MRN:  [id number]  Subjective:  Jodi Evans has a history of schizoaffective disorder. She was admitted floridly psychotic in the context of medication noncompliance. Apparently her son kicked her out of the house. We were unable to get in touch with him yet. Disposition is unclear.  Jodi Evans has no complaints today but again was observed talking to herself. When asked about her voices she told me that she talks Satan. She is poorly groomed, in bed most of the time, has difficulty participating in programming. She has no somatic complaints. She accepts medications including Prolixin decanoate injection and seems to tolerate them well.  Today she tells me she continues to hear a voice but the voice is not directly talking to her, she says these boys talks to others and therefore it does not bother her. She denies problems with his sleep, appetite, energy or concentration. She denies suicidal ideation, homicidal ideation.  She denies side effects from her current medications. She denies having any physical complaints today.  Compliance: 100% Oral intake: 100% of all meals Sleep: Only 5 hours--- patient was seen and sleeping during the daytime yesterday  Per nursing: D: Denies SI/HI/AVH, Flat, irritable during interaction. Pt was cooperative and compliant with medications. Seen socializing in milieu. Voiced no concerns.  A: Emotional support provided, Encouraged pt to continue with treatment plan and attend all group activities, Q15 min checks maintained for safety. R: Pt is receptive. cooperative with staff and other patients on the unit. Was irritable later in the evening. dumped her medications in a cup of water and then picked them out to take them after drinking the water. Refused interaction when attempt was made to find out why Pt was irritable. Remains free from harm. Staff will continue to monitor.  Principal Problem:  Schizoaffective disorder, bipolar type (HCC) Diagnosis:   Patient Active Problem List   Diagnosis Date Noted  . UTI (urinary tract infection) [N39.0] 03/12/2015  . Schizoaffective disorder, bipolar type (HCC) [F25.0] 03/08/2015   Total Time spent with patient: 20 minutes  Past Psychiatric History: Bipolar disorder.  Past Medical History:  Past Medical History  Diagnosis Date  . Bipolar 1 disorder (HCC)   . Paranoid schizophrenia (HCC)   . Hypertension    History reviewed. No pertinent past surgical history. Family History: History reviewed. No pertinent family history. Family Psychiatric  History: None reported. Social History:  History  Alcohol Use No     History  Drug Use No    Social History   Social History  . Marital Status: Single    Spouse Name: N/A  . Number of Children: N/A  . Years of Education: N/A   Social History Main Topics  . Smoking status: Former Games developermoker  . Smokeless tobacco: None  . Alcohol Use: No  . Drug Use: No  . Sexual Activity: Not Asked   Other Topics Concern  . None   Social History Narrative    Sleep: Fair  Appetite:  Good  Current Medications: Current Facility-Administered Medications  Medication Dose Route Frequency Provider Last Rate Last Dose  . acetaminophen (TYLENOL) tablet 650 mg  650 mg Oral Q6H PRN Jimmy FootmanAndrea Hernandez-Gonzalez, MD   650 mg at 03/13/15 0844  . alum & mag hydroxide-simeth (MAALOX/MYLANTA) 200-200-20 MG/5ML suspension 30 mL  30 mL Oral Q4H PRN Jimmy FootmanAndrea Hernandez-Gonzalez, MD      . amLODipine (NORVASC) tablet 5 mg  5 mg Oral Daily Jimmy FootmanAndrea Hernandez-Gonzalez, MD  5 mg at 03/16/15 0831  . benztropine (COGENTIN) tablet 1 mg  1 mg Oral BID Jimmy Footman, MD   1 mg at 03/16/15 0830  . carbamazepine (TEGRETOL) tablet 200 mg  200 mg Oral BID AC & HS Jolanta B Pucilowska, MD   200 mg at 03/16/15 0830  . fluPHENAZine (PROLIXIN) tablet 5 mg  5 mg Oral BID Jimmy Footman, MD   5 mg at 03/16/15 0830  .  fluPHENAZine decanoate (PROLIXIN) injection 50 mg  50 mg Intramuscular Q14 Days Jolanta B Pucilowska, MD   50 mg at 03/12/15 1009  . LORazepam (ATIVAN) tablet 1 mg  1 mg Oral Q6H PRN Jimmy Footman, MD       Or  . LORazepam (ATIVAN) injection 1 mg  1 mg Intramuscular Q6H PRN Jimmy Footman, MD      . magnesium hydroxide (MILK OF MAGNESIA) suspension 30 mL  30 mL Oral Daily PRN Jimmy Footman, MD      . traZODone (DESYREL) tablet 150 mg  150 mg Oral QHS Jimmy Footman, MD   150 mg at 03/15/15 2154    Lab Results:  No results found for this or any previous visit (from the past 48 hour(s)).  Physical Findings: AIMS:  , ,  ,  ,    CIWA:    COWS:     Musculoskeletal: Strength & Muscle Tone: within normal limits Gait & Station: normal Patient leans: N/A  Psychiatric Specialty Exam: Review of Systems  Constitutional: Negative.   HENT: Negative.   Eyes: Negative.   Respiratory: Negative.   Cardiovascular: Negative.   Gastrointestinal: Negative.   Genitourinary: Negative.   Musculoskeletal: Negative.   Skin: Negative.   Neurological: Negative.   Endo/Heme/Allergies: Negative.   Psychiatric/Behavioral: Negative.   All other systems reviewed and are negative.   Blood pressure 130/83, pulse 67, temperature 98.6 F (37 C), temperature source Oral, resp. rate 20, height 5\' 3"  (1.6 m), weight 79.833 kg (176 lb), SpO2 98 %.Body mass index is 31.18 kg/(m^2).  General Appearance: Disheveled  Eye Contact::  Minimal  Speech:  Clear and Coherent  Volume:  Normal  Mood:  Dysphoric  Affect:  Congruent  Thought Process:  Goal Directed  Orientation:  Full (Time, Place, and Person)  Thought Content:  Hallucinations: Auditory  Suicidal Thoughts:  No  Homicidal Thoughts:  No  Memory:  Immediate;   Fair Recent;   Fair Remote;   Fair  Judgement:  Poor  Insight:  Lacking  Psychomotor Activity:  Decreased  Concentration:  Fair  Recall:  [employer]  of Knowledge:Fair  Language: Fair  Akathisia:  No  Handed:  Right  AIMS (if indicated):     Assets:  Communication Skills Desire for Improvement Financial Resources/Insurance Physical Health Resilience  ADL's:  Intact  Cognition: WNL  Sleep:  Number of Hours: 5.15   Treatment Plan Summary: Daily contact with patient to assess and evaluate symptoms and progress in treatment and Medication management   Jodi Evans is a 66 year old female with a history of bipolar illness admitted for psychotic manic episode.  1. Mood and psychosis. The patient was restarted on Prolixin. We gave 50 mg of Prolixin Decanoate injection to improve compliance. We started Tegretol for mood stabilization. --- Patient is tolerating medications well denies side effects  2. Hypertension. She is on Norvasc.  3. Insomnia: Patient only slept 5 h last night.  Continue trazodone 150 mg for now  4. UTI. She was given a dose of fosfomycin.  5. Disposition. To be established.  Jimmy Footman 03/16/2015, 10:05 AM

## 2015-03-16 NOTE — [facility] Group Notes (Signed)
[facility] LCSW Group Therapy  03/16/2015 5:38 PM  Type of Therapy:  Group Therapy  Participation Level:  Did Not Attend   Modes of Intervention:  Discussion, Education, Socialization and Support  Summary of Progress/Problems: Todays topic: Grudges  Patients will be encouraged to discuss their thoughts, feelings, and behaviors as to why one holds on to grudges and reasons why people have grudges. Patients will process the impact of grudges on their daily lives and identify thoughts and feelings related to holding grudges. Patients will identify feelings and thoughts related to what life would look like without grudges.   Lainie Daubert L Kian Ottaviano MSW, LCSWA  03/16/2015, 5:38 PM   

## 2015-03-16 NOTE — Progress Notes (Addendum)
D:  Denies SI/HI/AVH,  Flat, irritable during interaction. Pt was cooperative and compliant with medications. Seen socializing in milieu. Voiced no concerns.  A: Emotional support provided, Encouraged pt to continue with treatment plan and attend all group activities, Q15 min checks maintained for safety. R: Pt is receptive. cooperative with staff and other patients on the unit. Was irritable later in the evening. dumped her medications in a cup of water and then picked them out to take them after drinking the water. Refused interaction when attempt was made to find out why Pt was irritable. Remains free from harm. Staff will continue to monitor.

## 2015-03-17 MED ORDER — FLUPHENAZINE DECANOATE 25 MG/ML IJ SOLN: 50.0000 mg | INTRAMUSCULAR | Status: AC

## 2015-03-17 MED ORDER — BENZTROPINE MESYLATE 0.5 MG PO TABS: 0.5000 mg | ORAL_TABLET | Freq: Every day | ORAL | Status: AC

## 2015-03-17 MED ORDER — FLUPHENAZINE HCL 5 MG PO TABS: 5.0000 mg | ORAL_TABLET | Freq: Two times a day (BID) | ORAL | Status: AC

## 2015-03-17 MED ORDER — AMLODIPINE BESYLATE 5 MG PO TABS: 5.0000 mg | ORAL_TABLET | Freq: Every day | ORAL | Status: DC

## 2015-03-17 MED ORDER — CARBAMAZEPINE 200 MG PO TABS: 200.0000 mg | ORAL_TABLET | Freq: Two times a day (BID) | ORAL | Status: AC

## 2015-03-17 MED ORDER — TRAZODONE HCL 150 MG PO TABS: 150.0000 mg | ORAL_TABLET | Freq: Every day | ORAL | Status: DC

## 2015-03-17 NOTE — Progress Notes (Signed)
D:  Per pt self inventory pt reports sleeping good, appetite good, energy level normal, ability to pay attention good, rates depression at a 0 out of 10, hopelessness at a 0 out of 10, anxiety at a 0 out of 10, denies SI/HI/AVH, goal today: "mood correctiveness, be stable", disheveled appearance, logical/coherent.     A:  Emotional support provided, Encouraged pt to continue with treatment plan and attend all group activities, q15 min checks maintained for safety.  R:  Pt is receptive, going to groups, pleasant and cooperative with staff and other patients on the unit.

## 2015-03-17 NOTE — Progress Notes (Signed)
[facility]  03/17/2015 2:18 PM Rich [name]  MRN:  [id number]  Subjective:   Ms. Jodi Pianonderson reports much improvement. She adamantly denies any psychotic symptoms. She is not suicidal or homicidal. She accepts and tolerates medications well. She came to the conclusion that her son is now [place] and she will not be staying with him any longer. She is open to go to the homeless shelter. There are no somatic complaints. There is good group participation.  Principal Problem: Schizoaffective disorder, bipolar type (HCC) Diagnosis:   Patient Active Problem List   Diagnosis Date Noted  . UTI (urinary tract infection) [N39.0] 03/12/2015  . Schizoaffective disorder, bipolar type (HCC) [F25.0] 03/08/2015   Total Time spent with patient: 20 minutes  Past Psychiatric History:  Bipolar disorder.  Past Medical History:  Past Medical History  Diagnosis Date  . Bipolar 1 disorder (HCC)   . Paranoid schizophrenia (HCC)   . Hypertension    History reviewed. No pertinent past surgical history. Family History: History reviewed. No pertinent family history. Family Psychiatric  History:  None reported. Social History:  History  Alcohol Use No     History  Drug Use No    Social History   Social History  . Marital Status: Single    Spouse Name: N/A  . Number of Children: N/A  . Years of Education: N/A   Social History Main Topics  . Smoking status: Former Games developermoker  . Smokeless tobacco: None  . Alcohol Use: No  . Drug Use: No  . Sexual Activity: Not Asked   Other Topics Concern  . None   Social History Narrative   Additional Social History:                         Sleep: Fair  Appetite:  Fair  Current Medications: Current Facility-Administered Medications  Medication Dose Route Frequency Provider Last Rate Last Dose  . acetaminophen (TYLENOL) tablet 650 mg  650 mg Oral Q6H PRN Jimmy FootmanAndrea Hernandez-Gonzalez, MD   650 mg at 03/13/15 0844  . alum & mag  hydroxide-simeth (MAALOX/MYLANTA) 200-200-20 MG/5ML suspension 30 mL  30 mL Oral Q4H PRN Jimmy FootmanAndrea Hernandez-Gonzalez, MD      . amLODipine (NORVASC) tablet 5 mg  5 mg Oral Daily Jimmy FootmanAndrea Hernandez-Gonzalez, MD   5 mg at 03/17/15 [id number]  . benztropine (COGENTIN) tablet 1 mg  1 mg Oral BID Jimmy FootmanAndrea Hernandez-Gonzalez, MD   1 mg at 03/17/15 [id number]  . carbamazepine (TEGRETOL) tablet 200 mg  200 mg Oral BID AC & HS Jodi Staiger B Keyani Rigdon, MD   200 mg at 03/17/15 0828  . fluPHENAZine (PROLIXIN) tablet 5 mg  5 mg Oral BID Jimmy FootmanAndrea Hernandez-Gonzalez, MD   5 mg at 03/17/15 [id number]  . fluPHENAZine decanoate (PROLIXIN) injection 50 mg  50 mg Intramuscular Q14 Days Jodi Evans B Elize Pinon, MD   50 mg at 03/12/15 1009  . LORazepam (ATIVAN) tablet 1 mg  1 mg Oral Q6H PRN Jimmy FootmanAndrea Hernandez-Gonzalez, MD       Or  . LORazepam (ATIVAN) injection 1 mg  1 mg Intramuscular Q6H PRN Jimmy FootmanAndrea Hernandez-Gonzalez, MD      . magnesium hydroxide (MILK OF MAGNESIA) suspension 30 mL  30 mL Oral Daily PRN Jimmy FootmanAndrea Hernandez-Gonzalez, MD      . traZODone (DESYREL) tablet 150 mg  150 mg Oral QHS Jimmy FootmanAndrea Hernandez-Gonzalez, MD   150 mg at 03/16/15 2106    Lab Results: No results found for this or any previous visit (  from the past 48 hour(s)).  Physical Findings: AIMS:  , ,  ,  ,    CIWA:    COWS:     Musculoskeletal: Strength & Muscle Tone: within normal limits Gait & Station: normal Patient leans: N/A  Psychiatric Specialty Exam: Review of Systems  All other systems reviewed and are negative.   Blood pressure 128/80, pulse 71, temperature 98.1 F (36.7 C), temperature source Oral, resp. rate 20, height 5\' 3"  (1.6 m), weight 79.833 kg (176 lb), SpO2 98 %.Body mass index is 31.18 kg/(m^2).  General Appearance: Fairly Groomed  Patent attorney::  Fair  Speech:  Clear and Coherent  Volume:  Normal  Mood:  Anxious  Affect:  Appropriate  Thought Process:  Goal Directed  Orientation:  Full (Time, Place, and Person)  Thought Content:  WDL   Suicidal Thoughts:  No  Homicidal Thoughts:  No  Memory:  Immediate;   Fair Recent;   Fair Remote;   Fair  Judgement:  Fair  Insight:  Present  Psychomotor Activity:  Normal  Concentration:  Fair  Recall:  [employer] of Knowledge:Fair  Language: Fair  Akathisia:  No  Handed:  Right  AIMS (if indicated):     Assets:  Communication Skills Desire for Improvement Financial Resources/Insurance Resilience  ADL's:  Intact  Cognition: WNL  Sleep:  Number of Hours: 7.15   Treatment Plan Summary: Daily contact with patient to assess and evaluate symptoms and progress in treatment and Medication management   Mrs. Rochin is a 65 year old female with a history of bipolar illness admitted for psychotic manic episode.  1. Mood and psychosis. The patient was restarted on Prolixin. We gave 50 mg of Prolixin Decanoate injection to improve compliance. We started Tegretol for mood stabilization.   2. Hypertension. She is on Norvasc.  3. Insomnia: Patient only slept 7 hours last night. Continue trazodone 150 mg.   4. UTI. She was given a dose of fosfomycin.  5. Disposition.  She will be discharged to the homeless shelter and follow up with [facility] in [place].  Kavin Weckwerth 03/17/2015, 2:18 PM

## 2015-03-17 NOTE — Progress Notes (Signed)
Recreation Therapy Notes  Date: 01.16.17 Time: 3:00 pm Location: Craft [facility]  Group Topic: Wellness  Goal Area(s) Addresses:  Patient will identify at least one item per dimension of health. Patient will examine areas they are deficient in.  Behavioral Response: Attentive, Interactive  Intervention: 6 Dimensions of Health  Activity: Patients were given a worksheet with the definitions of the 6 dimensions of health. Patients were given a worksheet with the 6 dimensions of health listed and were encouraged to write 2-3 things they are currently doing in each category.  Education: LRT educated patients on how this activity is related to their admission and d/c.  Education Outcome: In group clarification offered   Clinical Observations/Feedback: Patient completed activity by writing at least 2 items in each category. Patient left group at approximately 3:19 pm to use the bathroom. Patient returned to group at approximately 3:26 pm. Patient contributed to group discussion.  Jacquelynn CreeGreene,Osiel Stick M, LRT/CTRS 03/17/2015 4:40 PM

## 2015-03-17 NOTE — BHH Group Notes (Signed)
[facility] Group Notes:  (Nursing/MHT/Case Management/Adjunct)  Date:  03/17/2015  Time:  1:02 PM  Type of Therapy:  Group Therapy  Participation Level:  Minimal  Participation Quality:  Appropriate  Affect:  Appropriate  Cognitive:  Appropriate  Insight:  Good  Engagement in Group:  Limited  Modes of Intervention:  Activity  Summary of Progress/Problems:  Jodi Evans 03/17/2015, 1:02 PM

## 2015-03-17 NOTE — Progress Notes (Signed)
D: Denies SI/HI/AVH.Suspicious. Pt was cooperative and compliant with medications . Seen socializing in milieu. Voiced no concerns. Denies pain. A: Emotional support provided, Encouraged pt to continue with treatment plan and attend all group activities, Q15 min checks maintained for safety. R:Cooperative with staff and other patients on the unit. Noted to walk to Nurses station around 0300 to check time of day and return to room for the past two evenings. Remains free from harm. Staff will continue to monitor.

## 2015-03-18 NOTE — Progress Notes (Signed)
Patient is calm and cooperative. Patient denies SI and AVH. Patient pleasant on unit interacting with peers. No unsafe behaviors noted. Will continue to monitor.

## 2015-03-18 NOTE — Tx Team (Signed)
Interdisciplinary Treatment Plan Update (Adult)  Date:  03/18/2015 Time Reviewed:  12:21 PM  Progress in Treatment: Attending groups: No. Participating in groups:  No. Taking medication as prescribed:  Yes. Tolerating medication:  Yes. Family/Significant othe contact made:  No, will contact:  patient's son; patient's son is not returning phone calls Patient understands diagnosis:  Yes. Discussing patient identified problems/goals with staff:  Yes. Medical problems stabilized or resolved:  Yes. Denies suicidal/homicidal ideation: Yes. Issues/concerns per patient self-inventory:  No. Other:  New problem(s) identified: Yes, Describe:  patient is currently homeless and if she cannot return with family wants to go to [facility] shelter  Discharge Plan or Barriers: Patient will stabilize on medications and discharge to a shelter in [place] if she cannot return with family. Patient will need outpatient follow up in [facility] and has been to [facility] in the past.  Reason for Continuation of Hospitalization: Mania Medication stabilization  Comments:  Estimated length of stay:0 days discharge today Tuesday 03/18/15  New goal(s):  Review of initial/current patient goals per problem list:   1.  Goal(s):participate in care planning  Met:  Yes  Target date:by discharge  As evidenced ME:[id number] participation in aftercare planning  2.  Goal (s):decrease mania  Met:  Yes  Target date:by discharge  As evidenced NL:GXQJJHERDEYC decreased signs of mania   Attendees: Physician:  Orson Slick, MD 1/17/201712:21 PM  Nursing:   Denese Killings, RN 1/17/201712:21 PM  Other:  Carmell Austria, LCSWA 1/17/201712:21 PM  Other:   1/17/201712:21 PM  Other:   1/17/201712:21 PM  Other:  1/17/201712:21 PM  Other:  1/17/201712:21 PM  Other:  1/17/201712:21 PM  Other:  1/17/201712:21 PM  Other:  1/17/201712:21 PM  Other:  1/17/201712:21 PM  Other:   1/17/201712:21 PM    Scribe for Treatment Team:   Keene Breath, MSW, LCSWA [phone]  03/18/2015, 12:21 PM

## 2015-03-18 NOTE — BHH Suicide Risk Assessment (Signed)
[facility] Discharge Suicide Risk Assessment   Demographic Factors:  Low socioeconomic status and Living alone  Total Time spent with patient: 30 minutes  Musculoskeletal: Strength & Muscle Tone: within normal limits Gait & Station: normal Patient leans: N/A  Psychiatric Specialty Exam: Physical Exam  Nursing note and vitals reviewed.   Review of Systems  All other systems reviewed and are negative.   Blood pressure 135/67, pulse 72, temperature 98.1 F (36.7 C), temperature source Oral, resp. rate 20, height  (1.6 m), weight 79.833 kg (176 lb), SpO2 98 %.Body mass index is 31.18 kg/(m^2).  General Appearance: Fairly Groomed  Patent attorney::  Good  Speech:  Clear and Coherent409  Volume:  Normal  Mood:  Euthymic  Affect:  Appropriate  Thought Process:  Goal Directed  Orientation:  Full (Time, Place, and Person)  Thought Content:  WDL  Suicidal Thoughts:  No  Homicidal Thoughts:  No  Memory:  Immediate;   Fair Recent;   Fair Remote;   Fair  Judgement:  Impaired  Insight:  Shallow  Psychomotor Activity:  Normal  Concentration:  Fair  Recall:  [employer] of Knowledge:Fair  Language: Fair  Akathisia:  No  Handed:  Right  AIMS (if indicated):     Assets:  Communication Skills Desire for Improvement Financial Resources/Insurance Resilience Social Support  Sleep:  Number of Hours: 3  Cognition: WNL  ADL's:  Intact   Have you used any form of tobacco in the last 30 days? (Cigarettes, Smokeless Tobacco, Cigars, and/or Pipes): Yes  Has this patient used any form of tobacco in the last 30 days? (Cigarettes, Smokeless Tobacco, Cigars, and/or Pipes) Yes, A prescription for an FDA-approved tobacco cessation medication was offered at discharge and the patient refused  Mental Status Per Nursing Assessment::   On Admission:     Current Mental Status by Physician: NA  Loss Factors: Loss of significant relationship and Financial problems/change in socioeconomic  status  Historical Factors: Impulsivity  Risk Reduction Factors:   Sense of responsibility to family  Continued Clinical Symptoms:  Schizophrenia:   Paranoid or undifferentiated type  Cognitive Features That Contribute To Risk:  None    Suicide Risk:  Minimal: No identifiable suicidal ideation.  Patients presenting with no risk factors but with morbid ruminations; may be classified as minimal risk based on the severity of the depressive symptoms  Principal Problem: Schizoaffective disorder, bipolar type [facility]) Discharge Diagnoses:  Patient Active Problem List   Diagnosis Date Noted  . UTI (urinary tract infection) [N39.0] 03/12/2015  . Schizoaffective disorder, bipolar type (HCC) [F25.0] 03/08/2015    Follow-up Information    Follow up with [facility]. Go on 03/19/2015.   Why:  For follow-up care appt Wednesday 03/19/15 at 8:00am    Contact information:   [address] [place], [place] Ph [phone] Fax [phone] (walk in hours M-F 8am-4pm)       Plan Of Care/Follow-up recommendations:  Activity:  As tolerated. Diet:  Low sodium heart healthy. Other:  Keep follow-up appointments.  Is patient on multiple antipsychotic therapies at discharge:  No   Has Patient had three or more failed trials of antipsychotic monotherapy by history:  No  Recommended Plan for Multiple Antipsychotic Therapies: NA    Chevie Birkhead 03/18/2015, 8:45 AM

## 2015-03-18 NOTE — BHH Group Notes (Signed)
[facility] Group Notes:  (Nursing/MHT/Case Management/Adjunct)  Date:  03/18/2015  Time:  1:05 AM  Type of Therapy:  Group Therapy  Participation Level:  Minimal  Participation Quality:  Appropriate  Affect:  Appropriate  Cognitive:  Appropriate  Insight:  Appropriate  Engagement in Group:  Limited  Modes of Intervention:  n/a  Summary of Progress/Problems:  Jodi Evans 03/18/2015, 1:05 AM

## 2015-03-18 NOTE — Progress Notes (Signed)
Patient discharge to homeless shelter. Patient received and reviewed AVS and prescriptions. Patient transported to hospital vehicle via security. Part fare given to patient.

## 2015-03-18 NOTE — Discharge Summary (Signed)
Physician Discharge Summary Note  Patient:  Jodi Evans is an 65 y.o., female MRN:  [id number] DOB:  February 26, 1951 Patient phone:  [phone] (home)  Patient address:   [address] [place] [address],  Total Time spent with patient: 30 minutes  Date of Admission:  03/11/2015 Date of Discharge: 03/18/2015  Reason for Admission:  Psychotic break.  Identifying data. Ms. Leamy is a 65 year old female with a history of bipolar disorder.  Chief complaint. "I had an argument with my son."  History of present illness. Information was obtained from the patient and the chart. Ms. Butrum has a long history of bipolar illness with manic episodes every 7 years. Her last hospitalization was 9 years ago. She reports complete remission in between her episodes but claims to be taking all her medications as prescribed. She cannot name the prescribed medications. She reports that in the past week or so she started experiencing auditory hallucinations, she could not sleep, she felt restless and agitated, she was hyperreligious, impulsive and argumentative with her son. He reportedly packed her belongings and dropped her off at [facility] emergency room. The patient denies any symptoms of depression or anxiety. She reports that auditory hallucinations have been proving and she does not respond to them anymore. She denies use of alcohol or illicit substances.  Past psychiatric history. Since 1994 she has been admitted to the hospital 4 times every 7-9 years for manic episode. She's been tried on multiple medications including Risperdal, Seroquel, Prolixin. Prolixin reportedly works well. She does not remember taking mood stabilizers. She never attempted suicide.  Family psychiatric history. Sister with bipolar illness who committed suicide by overdose on drugs.  Social history. She lives with her son. She is retired. She will shortly get her pension and would like to live independently then.    Principal Problem: Schizoaffective disorder, bipolar type [facility]) Discharge Diagnoses: Patient Active Problem List   Diagnosis Date Noted  . UTI (urinary tract infection) [N39.0] 03/12/2015  . Schizoaffective disorder, bipolar type (HCC) [F25.0] 03/08/2015    Past Psychiatric History: Schizoaffective disorder.  Past Medical History:  Past Medical History  Diagnosis Date  . Bipolar 1 disorder (HCC)   . Paranoid schizophrenia (HCC)   . Hypertension    History reviewed. No pertinent past surgical history. Family History: History reviewed. No pertinent family history. Family Psychiatric  History: Bipolar, substance abuse completed suicide. Social History:  History  Alcohol Use No     History  Drug Use No    Social History   Social History  . Marital Status: Single    Spouse Name: N/A  . Number of Children: N/A  . Years of Education: N/A   Social History Main Topics  . Smoking status: Former Games developer  . Smokeless tobacco: None  . Alcohol Use: No  . Drug Use: No  . Sexual Activity: Not Asked   Other Topics Concern  . None   Social History Narrative    Hospital Course:    Mrs. Schemm is a 65 year old female with a history of bipolar illness admitted for psychotic manic episode.  1. Mood and psychosis. The patient was restarted on Prolixin. We gave 50 mg of Prolixin Decanoate injection to improve compliance. We started Tegretol for mood stabilization.   2. Hypertension. She was on Norvasc.  3. Insomnia: Patient was offered trazodone.    4. UTI. She was given a dose of fosfomycin.  5. Disposition. She was discharged to the homeless shelter. She will follow up  with [facility] in [place].  Physical Findings: AIMS:  , ,  ,  ,    CIWA:    COWS:     Musculoskeletal: Strength & Muscle Tone: within normal limits Gait & Station: normal Patient leans: N/A  Psychiatric Specialty Exam: Review of Systems  All other systems reviewed and are negative.   Blood  pressure 135/67, pulse 72, temperature 98.1 F (36.7 C), temperature source Oral, resp. rate 20, height  (1.6 m), weight 79.833 kg (176 lb), SpO2 98 %.Body mass index is 31.18 kg/(m^2).  See the juice SRA.                                                  Sleep:  Number of Hours: 3   Have you used any form of tobacco in the last 30 days? (Cigarettes, Smokeless Tobacco, Cigars, and/or Pipes): Yes  Has this patient used any form of tobacco in the last 30 days? (Cigarettes, Smokeless Tobacco, Cigars, and/or Pipes) Yes, Yes, A prescription for an FDA-approved tobacco cessation medication was offered at discharge and the patient refused  Metabolic Disorder Labs:  Lab Results  Component Value Date   HGBA1C 5.5 03/12/2015   No results found for: PROLACTIN Lab Results  Component Value Date   CHOL 179 03/12/2015   TRIG 99 03/12/2015   HDL 58 03/12/2015   CHOLHDL 3.1 03/12/2015   VLDL 20 03/12/2015   LDLCALC 101* 03/12/2015    See Psychiatric Specialty Exam and Suicide Risk Assessment completed by Attending Physician prior to discharge.  Discharge destination:  Home  Is patient on multiple antipsychotic therapies at discharge:  No   Has Patient had three or more failed trials of antipsychotic monotherapy by history:  No  Recommended Plan for Multiple Antipsychotic Therapies: NA  Discharge Instructions    Diet - low sodium heart healthy    Complete by:  As directed      Increase activity slowly    Complete by:  As directed             Medication List    TAKE these medications      Indication   amLODipine 5 MG tablet  Commonly known as:  NORVASC  Take 1 tablet (5 mg total) by mouth daily.   Indication:  High Blood Pressure     benztropine 0.5 MG tablet  Commonly known as:  COGENTIN  Take 1 tablet (0.5 mg total) by mouth daily.   Indication:  Extrapyramidal Reaction caused by Medications     carbamazepine 200 MG tablet  Commonly known as:   TEGRETOL  Take 1 tablet (200 mg total) by mouth 2 (two) times daily at 8 am and 10 pm.   Indication:  Manic-Depression     fluPHENAZine 5 MG tablet  Commonly known as:  PROLIXIN  Take 1 tablet (5 mg total) by mouth 2 (two) times daily.   Indication:  Psychosis     fluPHENAZine decanoate 25 MG/ML injection  Commonly known as:  PROLIXIN  Inject 2 mLs (50 mg total) into the muscle every 14 (fourteen) days.   Indication:  Schizophrenia     traZODone 150 MG tablet  Commonly known as:  DESYREL  Take 1 tablet (150 mg total) by mouth at bedtime.   Indication:  Trouble Sleeping  Follow-up Information    Follow up with [facility]. Go on 03/19/2015.   Why:  For follow-up care appt Wednesday 03/19/15 at 8:00am    Contact information:   [address] [place], [place] Ph [phone] Fax [phone] (walk in hours M-F 8am-4pm)       Follow-up recommendations:  Activity:  As tolerated. Diet:  Low sodium heart healthy. Other:  Keep follow-up appointments.  Comments:     Signed: Jolanta Pucilowska 03/18/2015, 8:48 AM

## 2015-03-18 NOTE — Progress Notes (Signed)
  [employer] [facility] [employer] Adult Case Management Discharge Plan :  Will you be returning to the same living situation after discharge:  No. Patient cannot return home with her son but plans to go to a shelter in [place] At discharge, do you have transportation home?: No. Patient is give PART bus fare $3 to [facility] where she plans to go to the [employer] Do you have the ability to pay for your medications: Yes,  patient has Medicare  Release of information consent forms completed and in the chart;  Patient's signature needed at discharge.  Patient to Follow up at: Follow-up Information    Follow up with [facility]. Go on 03/19/2015.   Why:  For follow-up care appt Wednesday 03/19/15 at 8:00am (walk in hours M-F 8-3)   Contact information:   [address] [place], [place] Ph [phone] Fax [phone] (walk in hours M-F 8am-4pm)       Next level of care provider has access to [facility] Link:no  Safety Planning and Suicide Prevention discussed: Yes,  SPE discussed with patient but was unsuccessful in several attemts  Have you used any form of tobacco in the last 30 days? (Cigarettes, Smokeless Tobacco, Cigars, and/or Pipes): Yes  Has patient been referred to the Quitline?: Patient refused referral  Patient has been referred for addiction treatment: N/A  Lulu Riding, MSW, LCSWA [phone] 03/18/2015, 12:23 PM

## 2015-04-17 ENCOUNTER — Emergency Department
Admission: EM | Admit: 2015-04-17 | Discharge: 2015-04-19 | Disposition: A | Discharge status: Other Acute Inpatient Hospital | Payer: Medicare Other | Attending: Emergency Medicine | Admitting: Emergency Medicine

## 2015-04-17 DIAGNOSIS — I10 Essential (primary) hypertension: Secondary | ICD-10-CM

## 2015-04-17 DIAGNOSIS — Z79899 Other long term (current) drug therapy: Secondary | ICD-10-CM | POA: Diagnosis not present

## 2015-04-17 DIAGNOSIS — Z87891 Personal history of nicotine dependence: Secondary | ICD-10-CM | POA: Insufficient documentation

## 2015-04-17 DIAGNOSIS — R451 Restlessness and agitation: Secondary | ICD-10-CM | POA: Diagnosis not present

## 2015-04-17 DIAGNOSIS — F10120 Alcohol abuse with intoxication, uncomplicated: Secondary | ICD-10-CM | POA: Diagnosis not present

## 2015-04-17 DIAGNOSIS — Z880 Allergy status to penicillin: Secondary | ICD-10-CM | POA: Insufficient documentation

## 2015-04-17 DIAGNOSIS — Z008 Encounter for other general examination: Secondary | ICD-10-CM | POA: Diagnosis present

## 2015-04-17 DIAGNOSIS — F250 Schizoaffective disorder, bipolar type: Secondary | ICD-10-CM | POA: Diagnosis not present

## 2015-04-17 MED ORDER — ZIPRASIDONE MESYLATE 20 MG IM SOLR
10.0000 mg | Freq: Once | INTRAMUSCULAR | Status: AC
  Administered 2015-04-17: 10 mg via INTRAMUSCULAR

## 2015-04-17 MED ORDER — ZIPRASIDONE MESYLATE 20 MG IM SOLR
INTRAMUSCULAR | Status: AC
  Administered 2015-04-17: 10 mg via INTRAMUSCULAR
  Filled 2015-04-17: qty 20

## 2015-04-17 NOTE — ED Provider Notes (Signed)
[facility] Emergency Department Provider Note  ____________________________________________  Time seen: Approximately 11:33 PM  I have reviewed the triage vital signs and the nursing notes.   HISTORY  Chief Complaint Psychiatric Evaluation    HPI Jodi Evans is a 65 y.o. female brought to the ED by her son from home with a chief complaint of "I want to go to a rest home". Patient has a history of paranoid schizophrenia as well as bipolar disorder who presents agitated with racing thoughts. Patient is pacing back and forth, avoiding eye contact, occasionally having outbursts and seeming to respond to internal stimuli. Voices no medical complaints. Denies recent fever, chills, chest pain, shortness of breath, abdominal pain, nausea, vomiting, diarrhea. Denies recent travel or trauma. Denies active SI/HI.   Past Medical History  Diagnosis Date  . Bipolar 1 disorder (HCC)   . Paranoid schizophrenia (HCC)   . Hypertension     Patient Active Problem List   Diagnosis Date Noted  . UTI (urinary tract infection) 03/12/2015  . Schizoaffective disorder, bipolar type (HCC) 03/08/2015    History reviewed. No pertinent past surgical history.  Current Outpatient Rx  Name  Route  Sig  Dispense  Refill  . amLODipine (NORVASC) 5 MG tablet   Oral   Take 1 tablet (5 mg total) by mouth daily.   30 tablet   0   . benztropine (COGENTIN) 0.5 MG tablet   Oral   Take 1 tablet (0.5 mg total) by mouth daily.   60 tablet   0   . carbamazepine (TEGRETOL) 200 MG tablet   Oral   Take 1 tablet (200 mg total) by mouth 2 (two) times daily at 8 am and 10 pm.   60 tablet   0   . fluPHENAZine (PROLIXIN) 5 MG tablet   Oral   Take 1 tablet (5 mg total) by mouth 2 (two) times daily.   60 tablet   0   . fluPHENAZine decanoate (PROLIXIN) 25 MG/ML injection   Intramuscular   Inject 2 mLs (50 mg total) into the muscle every 14 (fourteen) days.   5 mL   1   . traZODone  (DESYREL) 150 MG tablet   Oral   Take 1 tablet (150 mg total) by mouth at bedtime.   30 tablet   0     Allergies Penicillins and Shellfish allergy  No family history on file.  Social History Social History  Substance Use Topics  . Smoking status: Former Games developer  . Smokeless tobacco: None  . Alcohol Use: No    Review of Systems Constitutional: No fever/chills Eyes: No visual changes. ENT: No sore throat. Cardiovascular: Denies chest pain. Respiratory: Denies shortness of breath. Gastrointestinal: No abdominal pain.  No nausea, no vomiting.  No diarrhea.  No constipation. Genitourinary: Negative for dysuria. Musculoskeletal: Negative for back pain. Skin: Negative for rash. Neurological: Negative for headaches, focal weakness or numbness. Psychiatric:Positive for agitation and hallucinations.  10-point ROS otherwise negative.  ____________________________________________   PHYSICAL EXAM:  VITAL SIGNS: ED Triage Vitals  Enc Vitals Group     BP --      Pulse --      Resp --      Temp --      Temp src --      SpO2 --      Weight --      Height --      Head Cir --      Peak Flow --  Pain Score --      Pain Loc --      Pain Edu? --      Excl. in GC? --     Constitutional: Alert and oriented. Well appearing and in mild acute distress. Agitated. Eyes: Conjunctivae are normal. PERRL. EOMI. Head: Atraumatic. Nose: No congestion/rhinnorhea. Mouth/Throat: Mucous membranes are moist.  Oropharynx non-erythematous. Neck: No stridor.   Cardiovascular: Normal rate, regular rhythm. Grossly normal heart sounds.  Good peripheral circulation. Respiratory: Normal respiratory effort.  No retractions. Lungs CTAB. Gastrointestinal: Soft and nontender. No distention. No abdominal bruits. No CVA tenderness. Musculoskeletal: No lower extremity tenderness nor edema.  No joint effusions. Neurologic:  Normal speech and language. No gross focal neurologic deficits are  appreciated. No gait instability. Skin:  Skin is warm, dry and intact. No rash noted. Psychiatric: Mood and affect are pressured. Speech and behavior are pressured.  ____________________________________________   LABS (all labs ordered are listed, but only abnormal results are displayed)  Labs Reviewed  CBC WITH DIFFERENTIAL/PLATELET - Abnormal; Notable for the following:    MCV 76.9 (*)    MCH 25.3 (*)    RDW 15.6 (*)    All other components within normal limits  COMPREHENSIVE METABOLIC PANEL - Abnormal; Notable for the following:    CO2 20 (*)    Glucose, Bld 121 (*)    BUN 26 (*)    Total Protein 8.4 (*)    All other components within normal limits  ACETAMINOPHEN LEVEL - Abnormal; Notable for the following:    Acetaminophen (Tylenol), Serum <10 (*)    All other components within normal limits  URINALYSIS COMPLETEWITH MICROSCOPIC ([facility] ONLY) - Abnormal; Notable for the following:    Color, Urine STRAW (*)    APPearance CLEAR (*)    Squamous Epithelial / LPF 0-5 (*)    All other components within normal limits  ETHANOL  SALICYLATE LEVEL  URINE DRUG SCREEN, QUALITATIVE ([facility] ONLY)   ____________________________________________  EKG  None ____________________________________________  RADIOLOGY  None ____________________________________________   PROCEDURES  Procedure(s) performed: None  Critical Care performed: No  ____________________________________________   INITIAL IMPRESSION / ASSESSMENT AND PLAN / ED COURSE  Pertinent labs & imaging results that were available during my care of the patient were reviewed by me and considered in my medical decision making (see chart for details).  65 year old female with a history of paranoid schizophrenia and bipolar disorder who presents agitated, paranoid and seeming to respond to internal stimuli. She was quite agitated upon her arrival to the treatment room, requiring intramuscular sedation. Will check screening  lab work including toxologic screen in place patient under involuntary commitment for her safety.  ----------------------------------------- 12:21 AM on 04/18/2015 -----------------------------------------  Patient is calm and cooperative. Food tray given to patient.  ----------------------------------------- 5:02 AM on 04/18/2015 -----------------------------------------  Patient is ambulatory with steady gait. Laboratory and urinalysis results unremarkable. At this time she is medically cleared and will be transferred to [facility] pending psychiatry consult today.  ____________________________________________   FINAL CLINICAL IMPRESSION(S) / ED DIAGNOSES  Final diagnoses:  Schizoaffective disorder, bipolar type (HCC)  Alcohol intoxication, uncomplicated (HCC)      Irean Hong, MD 04/18/15 [phone]

## 2015-04-18 ENCOUNTER — Encounter: Payer: Self-pay | Admitting: Emergency Medicine

## 2015-04-18 DIAGNOSIS — R451 Restlessness and agitation: Secondary | ICD-10-CM

## 2015-04-18 DIAGNOSIS — I10 Essential (primary) hypertension: Secondary | ICD-10-CM

## 2015-04-18 DIAGNOSIS — F250 Schizoaffective disorder, bipolar type: Secondary | ICD-10-CM

## 2015-04-18 LAB — URINE DRUG SCREEN, QUALITATIVE (ARMC ONLY)
Amphetamines, Ur Screen: NOT DETECTED
BARBITURATES, UR SCREEN: NOT DETECTED
BENZODIAZEPINE, UR SCRN: NOT DETECTED
CANNABINOID 50 NG, UR ~~LOC~~: NOT DETECTED
Cocaine Metabolite,Ur ~~LOC~~: NOT DETECTED
MDMA (Ecstasy)Ur Screen: NOT DETECTED
METHADONE SCREEN, URINE: NOT DETECTED
Opiate, Ur Screen: NOT DETECTED
Phencyclidine (PCP) Ur S: NOT DETECTED
TRICYCLIC, UR SCREEN: NOT DETECTED

## 2015-04-18 LAB — CBC WITH DIFFERENTIAL/PLATELET
Basophils Absolute: 0.1 10*3/uL (ref 0–0.1)
Basophils Relative: 2 %
Eosinophils Absolute: 0.1 10*3/uL (ref 0–0.7)
Eosinophils Relative: 2 %
HCT: 39.1 % (ref 35.0–47.0)
Hemoglobin: 12.9 g/dL (ref 12.0–16.0)
Lymphocytes Relative: 27 %
Lymphs Abs: 2.3 10*3/uL (ref 1.0–3.6)
MCH: 25.3 pg — ABNORMAL LOW (ref 26.0–34.0)
MCHC: 33 g/dL (ref 32.0–36.0)
MCV: 76.9 fL — ABNORMAL LOW (ref 80.0–100.0)
Monocytes Absolute: 0.8 10*3/uL (ref 0.2–0.9)
Monocytes Relative: 9 %
Neutro Abs: 5.2 10*3/uL (ref 1.4–6.5)
Neutrophils Relative %: 60 %
Platelets: 192 10*3/uL (ref 150–440)
RBC: 5.09 MIL/uL (ref 3.80–5.20)
RDW: 15.6 % — ABNORMAL HIGH (ref 11.5–14.5)
WBC: 8.6 10*3/uL (ref 3.6–11.0)

## 2015-04-18 LAB — URINALYSIS COMPLETE WITH MICROSCOPIC (ARMC ONLY)
BACTERIA UA: NONE SEEN
Bilirubin Urine: NEGATIVE
Glucose, UA: NEGATIVE mg/dL
HGB URINE DIPSTICK: NEGATIVE
Ketones, ur: NEGATIVE mg/dL
Leukocytes, UA: NEGATIVE
NITRITE: NEGATIVE
PH: 6 (ref 5.0–8.0)
PROTEIN: NEGATIVE mg/dL
RBC / HPF: NONE SEEN RBC/hpf (ref 0–5)
SPECIFIC GRAVITY, URINE: 1.01 (ref 1.005–1.030)

## 2015-04-18 LAB — ACETAMINOPHEN LEVEL: Acetaminophen (Tylenol), Serum: <10 ug/mL — ABNORMAL LOW (ref 10–30)

## 2015-04-18 LAB — SALICYLATE LEVEL: Salicylate Lvl: <4 mg/dL (ref 2.8–30.0)

## 2015-04-18 LAB — COMPREHENSIVE METABOLIC PANEL
ALBUMIN: 4.1 g/dL (ref 3.5–5.0)
ALK PHOS: 104 U/L (ref 38–126)
ALT: 23 U/L (ref 14–54)
ANION GAP: 12 (ref 5–15)
AST: 33 U/L (ref 15–41)
BILIRUBIN TOTAL: 0.5 mg/dL (ref 0.3–1.2)
BUN: 26 mg/dL — AB (ref 6–20)
CALCIUM: 9.4 mg/dL (ref 8.9–10.3)
CO2: 20 mmol/L — ABNORMAL LOW (ref 22–32)
Chloride: 109 mmol/L (ref 101–111)
Creatinine, Ser: 0.89 mg/dL (ref 0.44–1.00)
GFR calc Af Amer: >60 mL/min (ref >60)
GLUCOSE: 121 mg/dL — AB (ref 65–99)
Potassium: 3.7 mmol/L (ref 3.5–5.1)
Sodium: 141 mmol/L (ref 135–145)
TOTAL PROTEIN: 8.4 g/dL — AB (ref 6.5–8.1)

## 2015-04-18 LAB — ETHANOL: Alcohol, Ethyl (B): <5 mg/dL

## 2015-04-18 MED ORDER — BENZTROPINE MESYLATE 0.5 MG PO TABS
0.5000 mg | ORAL_TABLET | Freq: Two times a day (BID) | ORAL | Status: DC
  Administered 2015-04-18: 0.5 mg via ORAL
  Filled 2015-04-18 (×3): qty 1

## 2015-04-18 MED ORDER — FLUPHENAZINE DECANOATE 25 MG/ML IJ SOLN
50.0000 mg | Freq: Once | INTRAMUSCULAR | Status: AC
Start: 2015-04-18 — End: 2015-04-18
  Administered 2015-04-18: 50 mg via INTRAMUSCULAR
  Filled 2015-04-18: qty 2

## 2015-04-18 MED ORDER — LORAZEPAM 2 MG PO TABS: 2.0000 mg | ORAL_TABLET | ORAL | Status: DC | PRN

## 2015-04-18 MED ORDER — CARBAMAZEPINE 200 MG PO TABS
200.0000 mg | ORAL_TABLET | Freq: Three times a day (TID) | ORAL | Status: DC
  Administered 2015-04-18: 200 mg via ORAL
  Filled 2015-04-18 (×4): qty 1

## 2015-04-18 MED ORDER — TRAZODONE HCL 50 MG PO TABS
150.0000 mg | ORAL_TABLET | Freq: Every day | ORAL | Status: DC
  Filled 2015-04-18: qty 1

## 2015-04-18 MED ORDER — FLUPHENAZINE HCL 5 MG PO TABS
5.0000 mg | ORAL_TABLET | Freq: Two times a day (BID) | ORAL | Status: DC
  Administered 2015-04-18 – 2015-04-19 (×2): 5 mg via ORAL
  Filled 2015-04-18 (×3): qty 1

## 2015-04-18 MED ORDER — AMLODIPINE BESYLATE 5 MG PO TABS
5.0000 mg | ORAL_TABLET | Freq: Every day | ORAL | Status: DC
  Administered 2015-04-18: 5 mg via ORAL
  Filled 2015-04-18 (×2): qty 1

## 2015-04-18 NOTE — ED Notes (Signed)
Pt states "I don't want it, it's too cold." Offered to warm up sandwich, pt refused. Pt requesting water to drink. Water provided to patient.

## 2015-04-18 NOTE — ED Notes (Signed)
Pt given breakfast tray and drink 

## 2015-04-18 NOTE — ED Notes (Signed)
Pt states "I added put water in my urine." MD notified. Lab notified to still run specimen per MD.

## 2015-04-18 NOTE — ED Notes (Signed)
Pt resting with eyes closed, lights and TV turned off for pt comfort. NAD noted. Respirations even and unlabored. Will continue every 15 min observations.

## 2015-04-18 NOTE — BH Assessment (Signed)
Referral information for Geriatric Placement have been faxed to;    [facility]([phone]),    [facility] [phone])   [facility]([phone]),    [facility] [phone])   [facility]([phone]),    [facility]([phone]),    [phone]),    [facility]([phone]).

## 2015-04-18 NOTE — ED Notes (Signed)
Resumed care from [place]; pt is in and out of room, talking (mostly) pleasantly with staff. Pt is speaking rapidly and displays manic tendencies, but mostly calm and cooperative.

## 2015-04-18 NOTE — ED Notes (Signed)
Pt dumped water brought to her for drinking in sink.

## 2015-04-18 NOTE — BH Assessment (Signed)
Assessment Note  Jodi Evans is an 65 y.o. female presenting to the ED under IVC.  Pt reports she was dropped off by her because "I am getting on his nerves".  She states that she needs to be placed in a "home because she has been to every hospital in [place]".  She says that she is not suicidal and does not want to hurt anyone.  During this assessment, pt was responding to external stimuli and was having a conversation with "Junior", although no one else was in the room with [place].  This Clinical research associate was not able to gather additional information from patient and attempts to reach her son were unsuccessful.   Diagnosis: Psychotic  Past Medical History:  Past Medical History  Diagnosis Date  . Bipolar 1 disorder (HCC)   . Paranoid schizophrenia (HCC)   . Hypertension     History reviewed. No pertinent past surgical history.  Family History: No family history on file.  Social History:  reports that she has quit smoking. She does not have any smokeless tobacco history on file. She reports that she does not drink alcohol or use illicit drugs.  Additional Social History:  Alcohol / Drug Use History of alcohol / drug use?: No history of alcohol / drug abuse  CIWA: CIWA-Ar BP: (!) 153/91 mmHg Pulse Rate: (!) 119 COWS:    Allergies:  Allergies  Allergen Reactions  . Penicillins     Has patient had a PCN reaction causing immediate rash, facial/tongue/throat swelling, SOB or lightheadedness with hypotension:denied allergy Has patient had a PCN reaction causing severe rash involving mucus membranes or skin necrosis: denied allergy Has patient had a PCN reaction that required hospitalization denied allergy Has patient had a PCN reaction occurring within the last 10 years:denied allergy If all of the above answers are "NO", then may proceed with Cephalosporin use. Patient denies a  . Shellfish Allergy     Seafood-turns legs purple    Home Medications:  (Not in a hospital admission)  OB/GYN  Status:  No LMP recorded. Patient is postmenopausal.  General Assessment Data Location of Assessment: [facility] ED TTS Assessment: In system Is this a Tele or Face-to-Face Assessment?: Face-to-Face Is this an Initial Assessment or a Re-assessment for this encounter?: Initial Assessment Marital status: Single Maiden name: N/A Is patient pregnant?: No Pregnancy Status: No Living Arrangements: Children Can pt return to current living arrangement?: No (Pt reports that her son does not want her living with him.) Admission Status: Other (Comment) (IVC papers pending.) Is patient capable of signing voluntary admission?: No Referral Source: Self/Family/Friend Insurance type: Medicare     Crisis Care Plan Living Arrangements: Children Legal Guardian: Other: (self) Name of Psychiatrist: Unable to assess Name of Therapist: Unable to assess  Education Status Is patient currently in school?: No Current Grade: N/A Highest grade of school patient has completed: N/A Name of school: N/A Contact person: N/A  Risk to self with the past 6 months Suicidal Ideation: No Has patient been a risk to self within the past 6 months prior to admission? : No Suicidal Intent: No Has patient had any suicidal intent within the past 6 months prior to admission? : No Suicidal Plan?: No Has patient had any suicidal plan within the past 6 months prior to admission? : No Access to Means: No What has been your use of drugs/alcohol within the last 12 months?: No use reported Previous Attempts/Gestures: No How many times?: 0 Other Self Harm Risks: None identified Triggers for  Past Attempts: None known Intentional Self Injurious Behavior: None Family Suicide History: Unable to assess, Unknown Recent stressful life event(s): Other (Comment) Persecutory voices/beliefs?: No Depression: No Substance abuse history and/or treatment for substance abuse?: No Suicide prevention information given to non-admitted patients:  Not applicable  Risk to Others within the past 6 months Homicidal Ideation: No Does patient have any lifetime risk of violence toward others beyond the six months prior to admission? : No Thoughts of Harm to Others: No Current Homicidal Intent: No Current Homicidal Plan: No Access to Homicidal Means: No Identified Victim: None identified History of harm to others?: No Assessment of Violence: None Noted Violent Behavior Description: None identified Does patient have access to weapons?: No Criminal Charges Pending?: No Does patient have a court date: No Is patient on probation?: No  Psychosis Hallucinations: Visual, Auditory Delusions: Unspecified  Mental Status Report Appearance/Hygiene: In scrubs Eye Contact: Good Motor Activity: Agitation, Hyperactivity Speech: Unremarkable Level of Consciousness: Alert Mood: Pleasant Affect: Appropriate to circumstance Anxiety Level: Minimal Thought Processes: Flight of Ideas Judgement: Partial Orientation: Person, Place, Time, Situation Obsessive Compulsive Thoughts/Behaviors: None  Cognitive Functioning Concentration: Fair Memory: Recent Intact IQ: Average Insight: Fair Impulse Control: Fair Appetite: Good Weight Loss: 0 Weight Gain: 0 Sleep: Unable to Assess Vegetative Symptoms: None  ADLScreening [facility] Assessment Services) Patient's cognitive ability adequate to safely complete daily activities?: Yes Patient able to express need for assistance with ADLs?: Yes Independently performs ADLs?: Yes (appropriate for developmental age)  Prior Inpatient Therapy Prior Inpatient Therapy: Yes Prior Therapy Dates: 11/2014; 03/2015 Prior Therapy Facilty/Provider(s): [facility] ; [facility] Reason for Treatment: Bipolar ; Psychosis  Prior Outpatient Therapy Prior Outpatient Therapy: No Prior Therapy Dates: N/A Prior Therapy Facilty/Provider(s): N/A Reason for Treatment: N/A Does patient have an ACCT team?: Unknown Does patient have  Intensive In-House Services?  : No Does patient have [facility] services? : No Does patient have P4CC services?: No  ADL Screening (condition at time of admission) Patient's cognitive ability adequate to safely complete daily activities?: Yes Patient able to express need for assistance with ADLs?: Yes Independently performs ADLs?: Yes (appropriate for developmental age)       Abuse/Neglect Assessment (Assessment to be complete while patient is alone) Physical Abuse: Denies Verbal Abuse: Denies Sexual Abuse: Denies Exploitation of patient/patient's resources: Denies Self-Neglect: Denies Values / Beliefs Cultural Requests During Hospitalization: None Spiritual Requests During Hospitalization: None Consults Spiritual Care Consult Needed: No Social Work Consult Needed: Yes (Comment) (Pt reports she can no longer with son and needs placement in a facility.)      Additional Information 1:1 In Past 12 Months?: No CIRT Risk: No Elopement Risk: No Does patient have medical clearance?: No     Disposition:  Disposition Initial Assessment Completed for this Encounter: Yes Disposition of Patient: Other dispositions Other disposition(s): Other (Comment) (Psych MD Consult)  On Site Evaluation by:   Reviewed with Physician:    [place] 04/18/2015 12:37 AM

## 2015-04-18 NOTE — ED Notes (Signed)
BEHAVIORAL HEALTH ROUNDING Patient sleeping: No. Patient alert and oriented: no Behavior appropriate: Yes.  ; If no, describe:  Nutrition and fluids offered: Yes  Toileting and hygiene offered: Yes  Sitter present: no Law enforcement present: Yes  

## 2015-04-18 NOTE — ED Notes (Signed)
Pt requesting meal, pt provided with meal tray.

## 2015-04-18 NOTE — ED Notes (Signed)
Pt agitated, attempting to obtain vital signs. Pt moving arm up and down while this RN and tech are attempting to obtain bp, pt states "its too tight..my blood pressure better not be high." informed pt to maintain still in order to get bp, pt continuous to move around.

## 2015-04-18 NOTE — ED Notes (Signed)
BEHAVIORAL HEALTH ROUNDING Patient sleeping: No. Patient alert and oriented: no, pt is not oriented to place, time or situation Behavior appropriate: Yes.  ; If no, describe:  Nutrition and fluids offered: Yes  Toileting and hygiene offered: Yes  Sitter present: no Law enforcement present: Yes

## 2015-04-18 NOTE — ED Notes (Signed)
Pt up to bathroom and back into bed. Pt ambulated with steady gait.

## 2015-04-18 NOTE — ED Notes (Signed)
Pt ambulated to bathroom with steady gait. 

## 2015-04-18 NOTE — ED Notes (Signed)
During assessment, pt displayed rapid talking with loose associations and tangential ideas. Pt rapidly went from discussing race to sexual matters to Halloween in a matter of less than one minute. Pt currently in her room (leaves room periodically but is able to be redirected back into room relatively easily) talking to herself.

## 2015-04-18 NOTE — ED Notes (Signed)
Pt reports was dropped off by son. Pt agitated, having racing thoughts. Pt reports she wants placement in a "rest home." Pt pacing back in forth in room.

## 2015-04-18 NOTE — Consult Note (Signed)
[place] Psychiatry Consult   Reason for Consult:  Consult for this 65 year old woman with a history of schizoaffective disorder who was brought into the hospital apparently by her son because of worsening symptoms Referring Physician:  Marcelene Butte Patient Identification: Jodi Evans MRN:  [id number] Principal Diagnosis: Schizoaffective disorder, bipolar type [facility]) Diagnosis:   Patient Active Problem List   Diagnosis Date Noted  . Hypertension [I10] 04/18/2015  . Agitation [R45.1] 04/18/2015  . UTI (urinary tract infection) [N39.0] 03/12/2015  . Schizoaffective disorder, bipolar type ([place]) [F25.0] 03/08/2015    Total Time spent with patient: 1 hour  Subjective:   Jodi Evans is a 65 y.o. 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  HPI:  Patient interviewed. Chart reviewed. Labs and vitals reviewed. Case discussed with TTS. This 65 year old woman was apparently dropped off at the hospital by her son because of worsening symptoms. The patient herself is not a very useful historian right now. She is aware that her mood is elevated but she is currently so manic and psychotic but it's impossible to have much of a lucid conversation. It's not clear whether she has stayed on her medicine since she was last here in January and if so whether she has followed up with anyone. She does deny that she's been drinking or abusing any drugs. Patient presents as being extremely typically euphorically manic. Labile mood and affect. Constant flight of ideas. Grandiose thinking. Agitated behavior. It looks like the DTS worker last night tried to reach the son without any success. Patient was here in the hospital in January of this year and was discharged on a combination of carbamazepine and Prolixin as primary psychiatric medicine. As far as I can tell she does not complain of any specific new medical issue. She denies any suicidal ideation. She makes a lot of agitated  grandiose statements but does not appear to be directly threatening anybody.  Social history: Evidently has been staying with her son but we don't know much more detail than that. She also describes herself at one point as being homeless.  Substance abuse history: Denies any significant ongoing growing alcohol or drug history. She does say that she occasionally will have some alcohol but again it's hard to follow the thread of what she is talking about or to trust what she is talking about.  Medical history: She has a known history of high blood pressure. Has had urinary tract infections in the past.  Past Psychiatric History: Based on what I can get from the chart there is apparently a history of recurrent manic and psychotic episodes that occur periodically with years of remission in between. We are given to understand that she at times will have 8 or 9 years of lack of symptoms in between her episodes. This episode however seems not to of gotten completely better since she was here in January. She has had hospitalizations in the past but it was about 8 years ago from what she says. She apparently responds fairly well to Prolixin and was put on Prolixin decanoate last time she was here. The patient also mentions risperidone but it's not clear whether she actually responded to it or not. She says that she has cut herself on the wrist one time but was when she was an adolescent and denies other suicide attempts. Doesn't make any specific threats.  Risk to Self: Suicidal Ideation: No Suicidal Intent: No Suicidal Plan?: No Access to Means: No What has been your use of  drugs/alcohol within the last 12 months?: No use reported How many times?: 0 Other Self Harm Risks: None identified Triggers for Past Attempts: None known Intentional Self Injurious Behavior: None Risk to Others: Homicidal Ideation: No Thoughts of Harm to Others: No Current Homicidal Intent: No Current Homicidal Plan: No Access to  Homicidal Means: No Identified Victim: None identified History of harm to others?: No Assessment of Violence: None Noted Violent Behavior Description: None identified Does patient have access to weapons?: No Criminal Charges Pending?: No Does patient have a court date: No Prior Inpatient Therapy: Prior Inpatient Therapy: Yes Prior Therapy Dates: 11/2014; 03/2015 Prior Therapy Facilty/Provider(s): [facility] ; [facility] Reason for Treatment: Bipolar ; Psychosis Prior Outpatient Therapy: Prior Outpatient Therapy: No Prior Therapy Dates: N/A Prior Therapy Facilty/Provider(s): N/A Reason for Treatment: N/A Does patient have an ACCT team?: Unknown Does patient have Intensive In-House Services?  : No Does patient have [facility] services? : No Does patient have P4CC services?: No  Past Medical History:  Past Medical History  Diagnosis Date  . Bipolar 1 disorder ([place])   . Paranoid schizophrenia ([place])   . Hypertension    History reviewed. No pertinent past surgical history. Family History: No family history on file. Family Psychiatric  History: Patient was really not able to answer questions like this in any meaningful way Social History:  History  Alcohol Use No     History  Drug Use No    Social History   Social History  . Marital Status: Single    Spouse Name: N/A  . Number of Children: N/A  . Years of Education: N/A   Social History Main Topics  . Smoking status: Former Research scientist (life sciences)  . Smokeless tobacco: None  . Alcohol Use: No  . Drug Use: No  . Sexual Activity: Not Asked   Other Topics Concern  . None   Social History Narrative   Additional Social History:    Allergies:   Allergies  Allergen Reactions  . Penicillins     Has patient had a PCN reaction causing immediate rash, facial/tongue/throat swelling, SOB or lightheadedness with hypotension:denied allergy Has patient had a PCN reaction causing severe rash involving mucus membranes or skin necrosis: denied allergy Has  patient had a PCN reaction that required hospitalization denied allergy Has patient had a PCN reaction occurring within the last 10 years:denied allergy If all of the above answers are "NO", then may proceed with Cephalosporin use. Patient denies a  . Shellfish Allergy     Seafood-turns legs purple    Labs:  Results for orders placed or performed during the hospital encounter of 04/17/15 (from the past 48 hour(s))  CBC with Differential     Status: Abnormal   Collection Time: 04/17/15 11:48 PM  Result Value Ref Range   WBC 8.6 3.6 - 11.0 K/uL   RBC 5.09 3.80 - 5.20 MIL/uL   Hemoglobin 12.9 12.0 - 16.0 g/dL   HCT 39.1 35.0 - 47.0 %   MCV 76.9 (L) 80.0 - 100.0 fL   MCH 25.3 (L) 26.0 - 34.0 pg   MCHC 33.0 32.0 - 36.0 g/dL   RDW 15.6 (H) 11.5 - 14.5 %   Platelets 192 150 - 440 K/uL   Neutrophils Relative % 60 %   Neutro Abs 5.2 1.4 - 6.5 K/uL   Lymphocytes Relative 27 %   Lymphs Abs 2.3 1.0 - 3.6 K/uL   Monocytes Relative 9 %   Monocytes Absolute 0.8 0.2 - 0.9 K/uL   Eosinophils  Relative 2 %   Eosinophils Absolute 0.1 0 - 0.7 K/uL   Basophils Relative 2 %   Basophils Absolute 0.1 0 - 0.1 K/uL  Comprehensive metabolic panel     Status: Abnormal   Collection Time: 04/17/15 11:48 PM  Result Value Ref Range   Sodium 141 135 - 145 mmol/L   Potassium 3.7 3.5 - 5.1 mmol/L   Chloride 109 101 - 111 mmol/L   CO2 20 (L) 22 - 32 mmol/L   Glucose, Bld 121 (H) 65 - 99 mg/dL   BUN 26 (H) 6 - 20 mg/dL   Creatinine, Ser 0.89 0.44 - 1.00 mg/dL   Calcium 9.4 8.9 - 10.3 mg/dL   Total Protein 8.4 (H) 6.5 - 8.1 g/dL   Albumin 4.1 3.5 - 5.0 g/dL   AST 33 15 - 41 U/L   ALT 23 14 - 54 U/L   Alkaline Phosphatase 104 38 - 126 U/L   Total Bilirubin 0.5 0.3 - 1.2 mg/dL   GFR calc non Af Amer >60 >60 mL/min   GFR calc Af Amer >60 >60 mL/min    Comment: (NOTE) The eGFR has been calculated using the CKD EPI equation. This calculation has not been validated in all clinical situations. eGFR's  persistently <60 mL/min signify possible Chronic Kidney Disease.    Anion gap 12 5 - 15  Ethanol     Status: None   Collection Time: 04/17/15 11:48 PM  Result Value Ref Range   Alcohol, Ethyl (B) <5 <5 mg/dL    Comment:        LOWEST DETECTABLE LIMIT FOR SERUM ALCOHOL IS 5 mg/dL FOR MEDICAL PURPOSES ONLY   Acetaminophen level     Status: Abnormal   Collection Time: 04/17/15 11:48 PM  Result Value Ref Range   Acetaminophen (Tylenol), Serum <10 (L) 10 - 30 ug/mL    Comment:        THERAPEUTIC CONCENTRATIONS VARY SIGNIFICANTLY. A RANGE OF 10-30 ug/mL MAY BE AN EFFECTIVE CONCENTRATION FOR MANY PATIENTS. HOWEVER, SOME ARE BEST TREATED AT CONCENTRATIONS OUTSIDE THIS RANGE. ACETAMINOPHEN CONCENTRATIONS >150 ug/mL AT 4 HOURS AFTER INGESTION AND >50 ug/mL AT 12 HOURS AFTER INGESTION ARE OFTEN ASSOCIATED WITH TOXIC REACTIONS.   Salicylate level     Status: None   Collection Time: 04/17/15 11:48 PM  Result Value Ref Range   Salicylate Lvl <2.4 2.8 - 30.0 mg/dL  Urinalysis complete, with microscopic ([facility] only)     Status: Abnormal   Collection Time: 04/17/15 11:48 PM  Result Value Ref Range   Color, Urine STRAW (A) YELLOW   APPearance CLEAR (A) CLEAR   Glucose, UA NEGATIVE NEGATIVE mg/dL    Comment: RN JEANINA RODRIGUEZ NOTIFIED LAB THAT PATIENT MAY HAVE ADDED WATER TO URINE.  LAB ADVISED RN RESULTS COULD BE COMPROMISED.  RUN URINE AND RELEASE RESULTS AS IS PER RN JEANINA RODRIGUEZ ON 04/18/15 AT 0018 [facility]   Bilirubin Urine NEGATIVE NEGATIVE   Ketones, ur NEGATIVE NEGATIVE mg/dL   Specific Gravity, Urine 1.010 1.005 - 1.030   Hgb urine dipstick NEGATIVE NEGATIVE   pH 6.0 5.0 - 8.0   Protein, ur NEGATIVE NEGATIVE mg/dL   Nitrite NEGATIVE NEGATIVE   Leukocytes, UA NEGATIVE NEGATIVE   RBC / HPF NONE SEEN 0 - 5 RBC/hpf   WBC, UA 0-5 0 - 5 WBC/hpf   Bacteria, UA NONE SEEN NONE SEEN   Squamous Epithelial / LPF 0-5 (A) NONE SEEN  Urine Drug Screen, Qualitative ([facility] only)  Status: None   Collection Time: 04/17/15 11:48 PM  Result Value Ref Range   Tricyclic, Ur Screen NONE DETECTED NONE DETECTED   Amphetamines, Ur Screen NONE DETECTED NONE DETECTED   MDMA (Ecstasy)Ur Screen NONE DETECTED NONE DETECTED   Cocaine Metabolite,Ur Cherry NONE DETECTED NONE DETECTED   Opiate, Ur Screen NONE DETECTED NONE DETECTED   Phencyclidine (PCP) Ur S NONE DETECTED NONE DETECTED   Cannabinoid 50 Ng, Ur [place] NONE DETECTED NONE DETECTED   Barbiturates, Ur Screen NONE DETECTED NONE DETECTED   Benzodiazepine, Ur Scrn NONE DETECTED NONE DETECTED   Methadone Scn, Ur NONE DETECTED NONE DETECTED    Comment: (NOTE) 161  Tricyclics, urine               Cutoff 1000 ng/mL 200  Amphetamines, urine             Cutoff 1000 ng/mL 300  MDMA (Ecstasy), urine           Cutoff 500 ng/mL 400  Cocaine Metabolite, urine       Cutoff 300 ng/mL 500  Opiate, urine                   Cutoff 300 ng/mL 600  Phencyclidine (PCP), urine      Cutoff 25 ng/mL 700  Cannabinoid, urine              Cutoff 50 ng/mL 800  Barbiturates, urine             Cutoff 200 ng/mL 900  Benzodiazepine, urine           Cutoff 200 ng/mL 1000 Methadone, urine                Cutoff 300 ng/mL 1100 1200 The urine drug screen provides only a preliminary, unconfirmed 1300 analytical test result and should not be used for non-medical 1400 purposes. Clinical consideration and professional judgment should 1500 be applied to any positive drug screen result due to possible 1600 interfering substances. A more specific alternate chemical method 1700 must be used in order to obtain a confirmed analytical result.  1800 Gas chromato graphy / mass spectrometry (GC/MS) is the preferred 1900 confirmatory method.     Current Facility-Administered Medications  Medication Dose Route Frequency Provider Last Rate Last Dose  . amLODipine (NORVASC) tablet 5 mg  5 mg Oral Daily Gonzella Lex, MD      . carbamazepine (TEGRETOL) tablet 200 mg  200 mg  Oral TID Gonzella Lex, MD      . fluPHENAZine (PROLIXIN) tablet 5 mg  5 mg Oral BID AC John T Clapacs, MD      . fluPHENAZine decanoate (PROLIXIN) injection 50 mg  50 mg Intramuscular Once Gonzella Lex, MD      . traZODone (DESYREL) tablet 150 mg  150 mg Oral QHS Gonzella Lex, MD       Current Outpatient Prescriptions  Medication Sig Dispense Refill  . amLODipine (NORVASC) 5 MG tablet Take 1 tablet (5 mg total) by mouth daily. 30 tablet 0  . benztropine (COGENTIN) 0.5 MG tablet Take 1 tablet (0.5 mg total) by mouth daily. 60 tablet 0  . carbamazepine (TEGRETOL) 200 MG tablet Take 1 tablet (200 mg total) by mouth 2 (two) times daily at 8 am and 10 pm. 60 tablet 0  . fluPHENAZine (PROLIXIN) 5 MG tablet Take 1 tablet (5 mg total) by mouth 2 (two) times daily. 60 tablet 0  . fluPHENAZine decanoate (PROLIXIN)  25 MG/ML injection Inject 2 mLs (50 mg total) into the muscle every 14 (fourteen) days. 5 mL 1  . traZODone (DESYREL) 150 MG tablet Take 1 tablet (150 mg total) by mouth at bedtime. 30 tablet 0    Musculoskeletal: Strength & Muscle Tone: within normal limits Gait & Station: normal Patient leans: N/A  Psychiatric Specialty Exam: Review of Systems  Unable to perform ROS: psychiatric disorder    Blood pressure 143/98, pulse 91, temperature 98.1 F (36.7 C), temperature source Oral, resp. rate 17, height _0  (1.651 m), weight 83.915 kg (185 lb), SpO2 99 %.Body mass index is 30.79 kg/(m^2).  General Appearance: Disheveled  Eye Contact::  Good  Speech:  Pressured  Volume:  Increased  Mood:  Euphoric and Irritable  Affect:  Labile  Thought Process:  Disorganized and Loose  Orientation:  Full (Time, Place, and Person)  Thought Content:  Delusions, Ideas of Reference:   Paranoia Delusions and Paranoid Ideation  Suicidal Thoughts:  No  Homicidal Thoughts:  No  Memory:  Negative  Judgement:  Impaired  Insight:  Lacking  Psychomotor Activity:  Increased and Restlessness   Concentration:  Poor  Recall:  [place] of Knowledge:Fair  Language: Fair  Akathisia:  No  Handed:  Right  AIMS (if indicated):     Assets:  Physical Health Resilience  ADL's:  Intact  Cognition: Impaired,  Mild  Sleep:      Treatment Plan Summary: Daily contact with patient to assess and evaluate symptoms and progress in treatment, Medication management and Plan 65 year old woman who is currently presenting as an almost textbooks book example of euphoric mania. Thoughts are extremely disorganized and agitated. Speech is jumping all over the place to the point of being incoherent. Although she does not appear to be attempting to herself or anyone else she does not appear to be able to think clearly enough to take care of herself and she is certainly agitated in a way that would make it impossible for her to survive in public. Patient requires hospital level treatment for stabilization. She was given a injection of Geodon intramuscular which seems to have only a little bit of benefit so far. I am going to start her back on her Prolixin and Tegretol similar to what she had before but we will go to 3 times a day with the Tegretol dose. I will go ahead and give her a single injection of Prolixin decanoate 50 mg today plus her oral Prolixin. We will anticipate admitting her to the psychiatry ward as soon as possible if they can handle her. Blood pressure will be treated by restarting the amlodipine. Agitation treated with when necessary medication and redirection and getting her out of an overstimulating environment.  Disposition: Recommend psychiatric Inpatient admission when medically cleared. Supportive therapy provided about ongoing stressors.  Alethia Berthold, MD 04/18/2015 12:35 PM

## 2015-04-18 NOTE — ED Notes (Signed)
Informed pt we need to draw blood and change pt into paper scrubs, pt states "shut the hell up, no you ain't taking my stuff." Informed pt belongings would be kept in safe place. Pt continuously repeating "shut the hell up." MD notified pt is not cooperating.

## 2015-04-18 NOTE — BH Assessment (Signed)
Patient has been accepted to [facility].  Accepting physician is Dr. Estill Cotta.  Call report to [phone].  Representative was Alecia Lemming.  ER Staff is aware of it Albin Felling, ER Sect.; Dr. Cyril Loosen, ER MD & Philis Kendall., Patient's Nurse)      Writer called and left a HIPPA Compliant message with son Molly Maduro [phone]) and daughter (Nesi Turcott-[phone]) requesting a return phone call.   Patient can transfer tomorrow (04/19/2015) after 8am That's' when her bed will be available.

## 2015-04-19 NOTE — ED Provider Notes (Signed)
-----------------------------------------   11:00 AM on 04/19/2015 -----------------------------------------   Blood pressure 145/95, pulse 81, temperature 97.8 F (36.6 C), temperature source Oral, resp. rate 20, height  (1.651 m), weight 185 lb (83.915 kg), SpO2 98 %.  The patient had no acute events since last update.  Calm and cooperative at this time.  Disposition is pending per Psychiatry/Behavioral Medicine team recommendations.     Arnaldo Natal, MD 04/19/15 1100

## 2015-04-19 NOTE — ED Notes (Signed)
On RN in room pt cooperative and no hallucinations noted.  Now sitter reports pt is cursing and arguing with someone not in room.

## 2015-04-19 NOTE — ED Notes (Signed)
Pt ate breakfast.

## 2015-04-19 NOTE — ED Notes (Signed)
Pt given drink 

## 2015-04-19 NOTE — ED Notes (Signed)
Report received 

## 2015-04-19 NOTE — ED Provider Notes (Signed)
-----------------------------------------   6:59 AM on 04/19/2015 -----------------------------------------   Blood pressure 146/79, pulse 91, temperature 98.3 F (36.8 C), temperature source Oral, resp. rate 16, height  (1.651 m), weight 185 lb (83.915 kg), SpO2 98 %.  The patient had no acute events since last update.  Calm and cooperative at this time.  Disposition is pending per Psychiatry/Behavioral Medicine team recommendations.     Irean Hong, MD 04/19/15 [phone]

## 2015-04-19 NOTE — ED Notes (Signed)
Pt standing in doorway requesting nurse.  States " i need personal toiletries to clean up".  Sitter will get patient toothbrush/toothpaste/wipes per request with clean clothes to get cleaned up.

## 2015-04-19 NOTE — ED Notes (Signed)
Pt left in custody of [employer]. Eber Jones with [facility] called and notified pt on way

## 2016-04-07 ENCOUNTER — Encounter (HOSPITAL_COMMUNITY): Payer: Self-pay | Admitting: *Deleted

## 2016-04-07 ENCOUNTER — Emergency Department (HOSPITAL_COMMUNITY)
Admission: EM | Admit: 2016-04-07 | Discharge: 2016-04-08 | Disposition: A | Discharge status: Other Acute Inpatient Hospital | Payer: Medicare Other | Attending: Emergency Medicine | Admitting: Emergency Medicine

## 2016-04-07 DIAGNOSIS — Z0000 Encounter for general adult medical examination without abnormal findings: Secondary | ICD-10-CM

## 2016-04-07 DIAGNOSIS — I10 Essential (primary) hypertension: Secondary | ICD-10-CM | POA: Diagnosis not present

## 2016-04-07 DIAGNOSIS — Z79899 Other long term (current) drug therapy: Secondary | ICD-10-CM | POA: Diagnosis not present

## 2016-04-07 DIAGNOSIS — F250 Schizoaffective disorder, bipolar type: Secondary | ICD-10-CM | POA: Diagnosis not present

## 2016-04-07 DIAGNOSIS — F918 Other conduct disorders: Secondary | ICD-10-CM | POA: Diagnosis present

## 2016-04-07 DIAGNOSIS — R4689 Other symptoms and signs involving appearance and behavior: Secondary | ICD-10-CM

## 2016-04-07 LAB — CBC WITH DIFFERENTIAL/PLATELET
BASOS ABS: 0 10*3/uL (ref 0.0–0.1)
BASOS PCT: 0 %
EOS ABS: 0.1 10*3/uL (ref 0.0–0.7)
Eosinophils Relative: 2 %
HCT: 34.1 % — ABNORMAL LOW (ref 36.0–46.0)
HEMOGLOBIN: 11.5 g/dL — AB (ref 12.0–15.0)
Lymphocytes Relative: 23 %
Lymphs Abs: 1.4 10*3/uL (ref 0.7–4.0)
MCH: 26.6 pg (ref 26.0–34.0)
MCHC: 33.7 g/dL (ref 30.0–36.0)
MCV: 78.9 fL (ref 78.0–100.0)
Monocytes Absolute: 0.4 10*3/uL (ref 0.1–1.0)
Monocytes Relative: 6 %
NEUTROS ABS: 4.3 10*3/uL (ref 1.7–7.7)
NEUTROS PCT: 69 %
Platelets: 174 10*3/uL (ref 150–400)
RBC: 4.32 MIL/uL (ref 3.87–5.11)
RDW: 14.4 % (ref 11.5–15.5)
WBC: 6.3 10*3/uL (ref 4.0–10.5)

## 2016-04-07 LAB — RAPID URINE DRUG SCREEN, HOSP PERFORMED
Amphetamines: NOT DETECTED
BENZODIAZEPINES: NOT DETECTED
Barbiturates: NOT DETECTED
COCAINE: NOT DETECTED
OPIATES: NOT DETECTED
Tetrahydrocannabinol: NOT DETECTED
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
## 2016-04-07 NOTE — ED Notes (Signed)
Pt denies hearing voices, SI/HI, or taking any meds.  Pt informed will need blood.  Pt then stated "[facility]."  Pt informed has been IVC'd.  Pt then stated "they need to call my son, he is my caregiver, not April."  Questioned pt who was April.  Pt then stated "I don't know anyone named April.  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

## 2016-04-07 NOTE — ED Triage Notes (Signed)
Per EMS, patient from [place], escorted by [employer], IVC for "schizophrenic episodes." Staff stated patient was being aggressive and hallucinating. Ambulatory with EMS. A&Ox4.
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
## 2016-04-07 NOTE — ED Notes (Signed)
Bed: [id number] Expected date:  Expected time:  Means of arrival:  Comments: EMS 66 yo IVC

## 2016-04-08 ENCOUNTER — Emergency Department (HOSPITAL_COMMUNITY): Payer: Medicare Other

## 2016-04-08 DIAGNOSIS — F250 Schizoaffective disorder, bipolar type: Secondary | ICD-10-CM | POA: Diagnosis not present

## 2016-04-08 LAB — COMPREHENSIVE METABOLIC PANEL
ALT: 15 U/L (ref 14–54)
AST: 16 U/L (ref 15–41)
Albumin: 3.9 g/dL (ref 3.5–5.0)
Alkaline Phosphatase: 114 U/L (ref 38–126)
Anion gap: 7 (ref 5–15)
BUN: 15 mg/dL (ref 6–20)
CALCIUM: 8.9 mg/dL (ref 8.9–10.3)
CO2: 23 mmol/L (ref 22–32)
CREATININE: 0.95 mg/dL (ref 0.44–1.00)
Chloride: 112 mmol/L — ABNORMAL HIGH (ref 101–111)
Glucose, Bld: 103 mg/dL — ABNORMAL HIGH (ref 65–99)
Potassium: 3.7 mmol/L (ref 3.5–5.1)
Sodium: 142 mmol/L (ref 135–145)
Total Bilirubin: 0.3 mg/dL (ref 0.3–1.2)
Total Protein: 7.4 g/dL (ref 6.5–8.1)

## 2016-04-08 LAB — ETHANOL

## 2016-04-08 MED ORDER — ATORVASTATIN CALCIUM 10 MG PO TABS
10.0000 mg | ORAL_TABLET | ORAL | Status: DC
  Filled 2016-04-08: qty 1

## 2016-04-08 MED ORDER — FLUPHENAZINE HCL 5 MG PO TABS
5.0000 mg | ORAL_TABLET | Freq: Two times a day (BID) | ORAL | Status: DC
  Filled 2016-04-08 (×2): qty 1

## 2016-04-08 MED ORDER — OLANZAPINE 10 MG PO TBDP: 10.0000 mg | ORAL_TABLET | Freq: Three times a day (TID) | ORAL | Status: DC | PRN

## 2016-04-08 MED ORDER — FLUPHENAZINE DECANOATE 25 MG/ML IJ SOLN
50.0000 mg | INTRAMUSCULAR | Status: DC
  Filled 2016-04-08: qty 2

## 2016-04-08 MED ORDER — HYDROCHLOROTHIAZIDE 25 MG PO TABS
25.0000 mg | ORAL_TABLET | ORAL | Status: DC
  Filled 2016-04-08: qty 1

## 2016-04-08 MED ORDER — CARBAMAZEPINE 200 MG PO TABS: 200.0000 mg | ORAL_TABLET | Freq: Two times a day (BID) | ORAL | Status: DC

## 2016-04-08 MED ORDER — AMANTADINE HCL 50 MG/5ML PO SYRP
50.0000 mg | ORAL_SOLUTION | Freq: Two times a day (BID) | ORAL | Status: DC
  Filled 2016-04-08 (×2): qty 5

## 2016-04-08 MED ORDER — AMLODIPINE BESYLATE 5 MG PO TABS: 5.0000 mg | ORAL_TABLET | Freq: Every day | ORAL | Status: DC

## 2016-04-08 NOTE — ED Notes (Signed)
Patient transferred to [facility].  [place] [facility] [place] [place] [facility]  All belongings given to the [place].
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
## 2016-04-08 NOTE — ED Notes (Signed)
Patient continues to refuse tests and medications.

## 2016-04-08 NOTE — ED Notes (Signed)
Patient asked to use phone then became loud talking to her son.  Patient hung up and is calm now, standing at her door.

## 2016-04-08 NOTE — ED Notes (Signed)
Duffy Brucealled Fran NP to notify that patient would not accept any tests or medications.  Drenda FreezeFran said that as long as the patient was not acting out it was okay for her to refuse.

## 2016-04-08 NOTE — ED Notes (Signed)
MD and NP making rounds talking with patient.

## 2016-04-08 NOTE — Progress Notes (Signed)
CSW received a call from [facility]ace at [facility] stating the patient has been offered a bed and has been accepted and that the pt can arrive on 04/08/16  The pt's accepting doctor is Dr. Merilyn BabaUreh Lekwauwa.  The room number will be 423-B and that pt has to enter through the ED for registration.  The number for report is [phone]. CSW provided the pt's RN with this information and RN reported she will call transportation. Please reconsult if future social work needs arise.  CSW signing off.   Dorothe PeaJonathan F. Zyen Triggs, Theresia MajorsLCSWA, LCAS Clinical Social Worker Ph: [phone]

## 2016-04-08 NOTE — ED Notes (Signed)
Patient refusing all prior orders, medications, EKG, and UA.

## 2016-04-08 NOTE — ED Notes (Signed)
TTS at bedside. 

## 2016-04-08 NOTE — BH Assessment (Signed)
[facility] Assessment Progress Note  Per Thedore MinsMojeed Akintayo, MD, this pt requires psychiatric hospitalization at this time.  Pt presents under IVC initiated by pt's caregiver, which Dr Jannifer FranklinAkintayo has upheld.  At 13:45 Delorise ShinerGrace calls from [facility] to report that pt has been pre-accepted to their facility by Dr Seth BakeLekwauwa; she will call when they are ready to receive pt.  Alberteen SamFran Hobson, FNP concurs with this decision.  Pt's nurse, Aram BeechamCynthia, has been notified, and agrees to call report to [phone] when the time comes.  [facility]  Doylene Canninghomas Gyanna Jarema, MA Triage Specialist [phone]

## 2016-11-29 ENCOUNTER — Other Ambulatory Visit: Payer: Self-pay | Admitting: Internal Medicine

## 2016-11-29 DIAGNOSIS — Z1231 Encounter for screening mammogram for malignant neoplasm of breast: Secondary | ICD-10-CM

## 2016-12-08 ENCOUNTER — Other Ambulatory Visit: Payer: Self-pay | Admitting: Internal Medicine

## 2016-12-08 DIAGNOSIS — E2839 Other primary ovarian failure: Secondary | ICD-10-CM

## 2016-12-09 ENCOUNTER — Ambulatory Visit: Payer: Self-pay

## 2016-12-09 ENCOUNTER — Ambulatory Visit
Admission: RE | Admit: 2016-12-09 | Discharge: 2016-12-09 | Disposition: A | Discharge status: Home / Self Care | Payer: Medicare Other | Source: Ambulatory Visit | Attending: Internal Medicine | Admitting: Internal Medicine

## 2016-12-09 DIAGNOSIS — Z1231 Encounter for screening mammogram for malignant neoplasm of breast: Secondary | ICD-10-CM

## 2016-12-28 ENCOUNTER — Ambulatory Visit
Admission: RE | Admit: 2016-12-28 | Discharge: 2016-12-28 | Disposition: A | Discharge status: Home / Self Care | Payer: Medicare Other | Source: Ambulatory Visit | Attending: Internal Medicine | Admitting: Internal Medicine

## 2016-12-28 DIAGNOSIS — E2839 Other primary ovarian failure: Secondary | ICD-10-CM

## 2017-11-10 ENCOUNTER — Other Ambulatory Visit: Payer: Self-pay | Admitting: Internal Medicine

## 2017-11-10 DIAGNOSIS — Z1231 Encounter for screening mammogram for malignant neoplasm of breast: Secondary | ICD-10-CM

## 2017-12-14 ENCOUNTER — Ambulatory Visit
Admission: RE | Admit: 2017-12-14 | Discharge: 2017-12-14 | Disposition: A | Discharge status: Home / Self Care | Payer: Medicare Other | Source: Ambulatory Visit | Attending: Internal Medicine | Admitting: Internal Medicine

## 2017-12-14 DIAGNOSIS — Z1231 Encounter for screening mammogram for malignant neoplasm of breast: Secondary | ICD-10-CM

## 2018-11-20 ENCOUNTER — Other Ambulatory Visit: Payer: Self-pay | Admitting: Internal Medicine

## 2018-11-20 DIAGNOSIS — Z1231 Encounter for screening mammogram for malignant neoplasm of breast: Secondary | ICD-10-CM

## 2019-01-04 ENCOUNTER — Ambulatory Visit: Payer: Medicaid Other

## 2019-01-05 ENCOUNTER — Ambulatory Visit: Payer: Medicaid Other

## 2019-01-23 ENCOUNTER — Ambulatory Visit: Payer: Medicaid Other

## 2019-01-24 ENCOUNTER — Ambulatory Visit: Payer: Medicaid Other

## 2019-02-09 ENCOUNTER — Ambulatory Visit
Admission: RE | Admit: 2019-02-09 | Discharge: 2019-02-09 | Disposition: A | Discharge status: Home / Self Care | Payer: Medicare Other | Source: Ambulatory Visit | Attending: Internal Medicine | Admitting: Internal Medicine

## 2019-02-09 ENCOUNTER — Other Ambulatory Visit: Payer: Self-pay

## 2019-02-09 DIAGNOSIS — Z1231 Encounter for screening mammogram for malignant neoplasm of breast: Secondary | ICD-10-CM

## 2020-01-07 ENCOUNTER — Other Ambulatory Visit: Payer: Self-pay | Admitting: Internal Medicine

## 2020-01-07 DIAGNOSIS — Z1231 Encounter for screening mammogram for malignant neoplasm of breast: Secondary | ICD-10-CM

## 2020-02-15 ENCOUNTER — Ambulatory Visit
Admission: RE | Admit: 2020-02-15 | Discharge: 2020-02-15 | Disposition: A | Discharge status: Home / Self Care | Payer: Medicaid Other | Source: Ambulatory Visit | Attending: Internal Medicine | Admitting: Internal Medicine

## 2020-02-15 ENCOUNTER — Other Ambulatory Visit: Payer: Self-pay

## 2020-02-15 DIAGNOSIS — Z1231 Encounter for screening mammogram for malignant neoplasm of breast: Secondary | ICD-10-CM

## 2020-08-25 LAB — COLOGUARD: COLOGUARD: NEGATIVE

## 2021-07-01 ENCOUNTER — Other Ambulatory Visit: Payer: Self-pay | Admitting: Internal Medicine

## 2021-07-02 LAB — COMPLETE METABOLIC PANEL WITH GFR
AG Ratio: 1.1 (calc) (ref 1.0–2.5)
ALT: 11 U/L (ref 6–29)
AST: 14 U/L (ref 10–35)
Albumin: 4.1 g/dL (ref 3.6–5.1)
Alkaline phosphatase (APISO): 125 U/L (ref 37–153)
BUN/Creatinine Ratio: 14 (calc) (ref 6–22)
BUN: 16 mg/dL (ref 7–25)
CO2: 25 mmol/L (ref 20–32)
Calcium: 9.6 mg/dL (ref 8.6–10.4)
Chloride: 107 mmol/L (ref 98–110)
Creat: 1.14 mg/dL — ABNORMAL HIGH (ref 0.60–1.00)
Globulin: 3.6 g/dL (calc) (ref 1.9–3.7)
Glucose, Bld: 96 mg/dL (ref 65–99)
Potassium: 4.2 mmol/L (ref 3.5–5.3)
Sodium: 143 mmol/L (ref 135–146)
Total Bilirubin: 0.4 mg/dL (ref 0.2–1.2)
Total Protein: 7.7 g/dL (ref 6.1–8.1)
eGFR: 51 mL/min/{1.73_m2} — ABNORMAL LOW (ref >=60)

## 2021-07-02 LAB — LIPID PANEL
Cholesterol: 197 mg/dL (ref <200)
HDL: 54 mg/dL (ref >=50)
LDL Cholesterol (Calc): 117 mg/dL (calc) — ABNORMAL HIGH
Non-HDL Cholesterol (Calc): 143 mg/dL (calc) — ABNORMAL HIGH (ref <130)
Total CHOL/HDL Ratio: 3.6 (calc) (ref <5.0)
Triglycerides: 148 mg/dL (ref <150)

## 2021-07-02 LAB — CBC
HCT: 39.9 % (ref 35.0–45.0)
Hemoglobin: 12.9 g/dL (ref 11.7–15.5)
MCH: 26.2 pg — ABNORMAL LOW (ref 27.0–33.0)
MCHC: 32.3 g/dL (ref 32.0–36.0)
MCV: 81.1 fL (ref 80.0–100.0)
MPV: 12.7 fL — ABNORMAL HIGH (ref 7.5–12.5)
Platelets: 203 10*3/uL (ref 140–400)
RBC: 4.92 10*6/uL (ref 3.80–5.10)
RDW: 14.8 % (ref 11.0–15.0)
WBC: 4.8 10*3/uL (ref 3.8–10.8)

## 2021-07-02 LAB — VITAMIN D 25 HYDROXY (VIT D DEFICIENCY, FRACTURES): Vit D, 25-Hydroxy: 95 ng/mL (ref 30–100)

## 2021-07-02 LAB — TSH: TSH: 2.3 mIU/L (ref 0.40–4.50)

## 2021-07-02 LAB — VITAMIN B12: Vitamin B-12: 1207 pg/mL — ABNORMAL HIGH (ref 200–1100)

## 2021-07-02 LAB — FOLATE: Folate: >24 ng/mL

## 2021-08-05 ENCOUNTER — Other Ambulatory Visit: Payer: Self-pay | Admitting: Internal Medicine

## 2021-08-05 DIAGNOSIS — Z1231 Encounter for screening mammogram for malignant neoplasm of breast: Secondary | ICD-10-CM

## 2021-08-19 ENCOUNTER — Ambulatory Visit
Admission: RE | Admit: 2021-08-19 | Discharge: 2021-08-19 | Disposition: A | Discharge status: Home / Self Care | Payer: 59 | Source: Ambulatory Visit | Attending: Internal Medicine | Admitting: Internal Medicine

## 2021-08-19 DIAGNOSIS — Z1231 Encounter for screening mammogram for malignant neoplasm of breast: Secondary | ICD-10-CM

## 2022-01-04 ENCOUNTER — Other Ambulatory Visit: Payer: Self-pay | Admitting: Internal Medicine

## 2022-01-04 DIAGNOSIS — E2839 Other primary ovarian failure: Secondary | ICD-10-CM

## 2022-01-08 ENCOUNTER — Encounter (HOSPITAL_COMMUNITY): Payer: Self-pay

## 2022-01-08 ENCOUNTER — Emergency Department (HOSPITAL_COMMUNITY): Payer: 59

## 2022-01-08 ENCOUNTER — Other Ambulatory Visit: Payer: Self-pay

## 2022-01-08 ENCOUNTER — Inpatient Hospital Stay (HOSPITAL_COMMUNITY)
Admission: EM | Admit: 2022-01-08 | Discharge: 2022-01-12 | DRG: 178 | Disposition: A | Discharge status: Home Health | Payer: 59 | Attending: Internal Medicine | Admitting: Internal Medicine

## 2022-01-08 DIAGNOSIS — R7401 Elevation of levels of liver transaminase levels: Secondary | ICD-10-CM | POA: Diagnosis present

## 2022-01-08 DIAGNOSIS — E860 Dehydration: Secondary | ICD-10-CM | POA: Diagnosis present

## 2022-01-08 DIAGNOSIS — E785 Hyperlipidemia, unspecified: Secondary | ICD-10-CM | POA: Diagnosis present

## 2022-01-08 DIAGNOSIS — Z880 Allergy status to penicillin: Secondary | ICD-10-CM

## 2022-01-08 DIAGNOSIS — Z8679 Personal history of other diseases of the circulatory system: Secondary | ICD-10-CM

## 2022-01-08 DIAGNOSIS — M6282 Rhabdomyolysis: Secondary | ICD-10-CM | POA: Diagnosis present

## 2022-01-08 DIAGNOSIS — R197 Diarrhea, unspecified: Secondary | ICD-10-CM | POA: Diagnosis not present

## 2022-01-08 DIAGNOSIS — E872 Acidosis, unspecified: Secondary | ICD-10-CM | POA: Diagnosis present

## 2022-01-08 DIAGNOSIS — I10 Essential (primary) hypertension: Secondary | ICD-10-CM | POA: Diagnosis present

## 2022-01-08 DIAGNOSIS — E8729 Other acidosis: Secondary | ICD-10-CM | POA: Insufficient documentation

## 2022-01-08 DIAGNOSIS — F250 Schizoaffective disorder, bipolar type: Secondary | ICD-10-CM | POA: Diagnosis present

## 2022-01-08 DIAGNOSIS — N179 Acute kidney failure, unspecified: Secondary | ICD-10-CM | POA: Insufficient documentation

## 2022-01-08 DIAGNOSIS — Z87891 Personal history of nicotine dependence: Secondary | ICD-10-CM | POA: Diagnosis not present

## 2022-01-08 DIAGNOSIS — F0390 Unspecified dementia without behavioral disturbance: Secondary | ICD-10-CM | POA: Diagnosis present

## 2022-01-08 DIAGNOSIS — Z79899 Other long term (current) drug therapy: Secondary | ICD-10-CM | POA: Diagnosis not present

## 2022-01-08 DIAGNOSIS — R778 Other specified abnormalities of plasma proteins: Secondary | ICD-10-CM | POA: Diagnosis not present

## 2022-01-08 DIAGNOSIS — Z91013 Allergy to seafood: Secondary | ICD-10-CM

## 2022-01-08 DIAGNOSIS — I959 Hypotension, unspecified: Secondary | ICD-10-CM | POA: Diagnosis present

## 2022-01-08 DIAGNOSIS — E876 Hypokalemia: Secondary | ICD-10-CM | POA: Diagnosis present

## 2022-01-08 DIAGNOSIS — Z9181 History of falling: Secondary | ICD-10-CM | POA: Diagnosis not present

## 2022-01-08 DIAGNOSIS — U071 COVID-19: Principal | ICD-10-CM | POA: Diagnosis present

## 2022-01-08 DIAGNOSIS — A0839 Other viral enteritis: Secondary | ICD-10-CM | POA: Diagnosis present

## 2022-01-08 DIAGNOSIS — K8020 Calculus of gallbladder without cholecystitis without obstruction: Secondary | ICD-10-CM | POA: Diagnosis present

## 2022-01-08 DIAGNOSIS — R6510 Systemic inflammatory response syndrome (SIRS) of non-infectious origin without acute organ dysfunction: Secondary | ICD-10-CM | POA: Insufficient documentation

## 2022-01-08 DIAGNOSIS — R42 Dizziness and giddiness: Secondary | ICD-10-CM | POA: Diagnosis present

## 2022-01-08 DIAGNOSIS — R7989 Other specified abnormal findings of blood chemistry: Secondary | ICD-10-CM | POA: Diagnosis present

## 2022-01-08 LAB — COMPREHENSIVE METABOLIC PANEL
ALT: 283 U/L — ABNORMAL HIGH (ref 0–44)
ALT: 266 U/L — ABNORMAL HIGH (ref 0–44)
AST: 893 U/L — ABNORMAL HIGH (ref 15–41)
AST: 811 U/L — ABNORMAL HIGH (ref 15–41)
Albumin: 3.9 g/dL (ref 3.5–5.0)
Albumin: 3.6 g/dL (ref 3.5–5.0)
Alkaline Phosphatase: 95 U/L (ref 38–126)
Alkaline Phosphatase: 91 U/L (ref 38–126)
Anion gap: 20 — ABNORMAL HIGH (ref 5–15)
Anion gap: 15 (ref 5–15)
BUN: 80 mg/dL — ABNORMAL HIGH (ref 8–23)
BUN: 81 mg/dL — ABNORMAL HIGH (ref 8–23)
CO2: 14 mmol/L — ABNORMAL LOW (ref 22–32)
CO2: 16 mmol/L — ABNORMAL LOW (ref 22–32)
Calcium: 7.8 mg/dL — ABNORMAL LOW (ref 8.9–10.3)
Calcium: 7.9 mg/dL — ABNORMAL LOW (ref 8.9–10.3)
Chloride: 106 mmol/L (ref 98–111)
Chloride: 110 mmol/L (ref 98–111)
Creatinine, Ser: 4.4 mg/dL — ABNORMAL HIGH (ref 0.44–1.00)
Creatinine, Ser: 3.81 mg/dL — ABNORMAL HIGH (ref 0.44–1.00)
GFR, Estimated: 10 mL/min — ABNORMAL LOW (ref >60)
GFR, Estimated: 12 mL/min — ABNORMAL LOW (ref >60)
Glucose, Bld: 129 mg/dL — ABNORMAL HIGH (ref 70–99)
Glucose, Bld: 122 mg/dL — ABNORMAL HIGH (ref 70–99)
Potassium: 3.3 mmol/L — ABNORMAL LOW (ref 3.5–5.1)
Potassium: 3.3 mmol/L — ABNORMAL LOW (ref 3.5–5.1)
Sodium: 140 mmol/L (ref 135–145)
Sodium: 141 mmol/L (ref 135–145)
Total Bilirubin: 0.7 mg/dL (ref 0.3–1.2)
Total Bilirubin: 0.9 mg/dL (ref 0.3–1.2)
Total Protein: 8.6 g/dL — ABNORMAL HIGH (ref 6.5–8.1)
Total Protein: 7.7 g/dL (ref 6.5–8.1)

## 2022-01-08 LAB — CBC WITH DIFFERENTIAL/PLATELET
Abs Immature Granulocytes: 0.04 10*3/uL (ref 0.00–0.07)
Basophils Absolute: 0 10*3/uL (ref 0.0–0.1)
Basophils Relative: 0 %
Eosinophils Absolute: 0 10*3/uL (ref 0.0–0.5)
Eosinophils Relative: 0 %
HCT: 41.6 % (ref 36.0–46.0)
Hemoglobin: 14.1 g/dL (ref 12.0–15.0)
Immature Granulocytes: 0 %
Lymphocytes Relative: 8 %
Lymphs Abs: 1 10*3/uL (ref 0.7–4.0)
MCH: 26.4 pg (ref 26.0–34.0)
MCHC: 33.9 g/dL (ref 30.0–36.0)
MCV: 77.9 fL — ABNORMAL LOW (ref 80.0–100.0)
Monocytes Absolute: 0.8 10*3/uL (ref 0.1–1.0)
Monocytes Relative: 7 %
Neutro Abs: 10.4 10*3/uL — ABNORMAL HIGH (ref 1.7–7.7)
Neutrophils Relative %: 85 %
Platelets: 171 10*3/uL (ref 150–400)
RBC: 5.34 MIL/uL — ABNORMAL HIGH (ref 3.87–5.11)
RDW: 14.3 % (ref 11.5–15.5)
WBC: 12.3 10*3/uL — ABNORMAL HIGH (ref 4.0–10.5)
nRBC: 0.2 % (ref 0.0–0.2)

## 2022-01-08 LAB — URINALYSIS, ROUTINE W REFLEX MICROSCOPIC
Bilirubin Urine: NEGATIVE
Glucose, UA: NEGATIVE mg/dL
Ketones, ur: NEGATIVE mg/dL
Nitrite: NEGATIVE
Protein, ur: 30 mg/dL — AB
Specific Gravity, Urine: 1.013 (ref 1.005–1.030)
pH: 5 (ref 5.0–8.0)

## 2022-01-08 LAB — LACTIC ACID, PLASMA
Lactic Acid, Venous: 1.7 mmol/L (ref 0.5–1.9)
Lactic Acid, Venous: 2.3 mmol/L (ref 0.5–1.9)

## 2022-01-08 LAB — BASIC METABOLIC PANEL
Anion gap: 10 (ref 5–15)
BUN: 62 mg/dL — ABNORMAL HIGH (ref 8–23)
CO2: 13 mmol/L — ABNORMAL LOW (ref 22–32)
Calcium: 5.2 mg/dL — CL (ref 8.9–10.3)
Chloride: 120 mmol/L — ABNORMAL HIGH (ref 98–111)
Creatinine, Ser: 2.97 mg/dL — ABNORMAL HIGH (ref 0.44–1.00)
GFR, Estimated: 16 mL/min — ABNORMAL LOW (ref >60)
Glucose, Bld: 76 mg/dL (ref 70–99)
Potassium: 3.7 mmol/L (ref 3.5–5.1)
Sodium: 143 mmol/L (ref 135–145)

## 2022-01-08 LAB — HEPATITIS PANEL, ACUTE
HCV Ab: NONREACTIVE
Hep A IgM: NONREACTIVE
Hep B C IgM: NONREACTIVE
Hepatitis B Surface Ag: NONREACTIVE

## 2022-01-08 LAB — TROPONIN I (HIGH SENSITIVITY)
Troponin I (High Sensitivity): 238 ng/L (ref <18)
Troponin I (High Sensitivity): 164 ng/L (ref <18)

## 2022-01-08 LAB — LIPASE, BLOOD: Lipase: 66 U/L — ABNORMAL HIGH (ref 11–51)

## 2022-01-08 LAB — RESP PANEL BY RT-PCR (FLU A&B, COVID) ARPGX2
Influenza A by PCR: NEGATIVE
Influenza B by PCR: NEGATIVE
SARS Coronavirus 2 by RT PCR: POSITIVE — AB

## 2022-01-08 LAB — MAGNESIUM: Magnesium: 2.7 mg/dL — ABNORMAL HIGH (ref 1.7–2.4)

## 2022-01-08 MED ORDER — LACTATED RINGERS IV SOLN: INTRAVENOUS | Status: DC

## 2022-01-08 MED ORDER — CALCIUM GLUCONATE-NACL 1-0.675 GM/50ML-% IV SOLN
1.0000 g | Freq: Once | INTRAVENOUS | Status: AC
  Administered 2022-01-08: 1000 mg via INTRAVENOUS
  Filled 2022-01-08: qty 50

## 2022-01-08 MED ORDER — SODIUM CHLORIDE 0.9 % IV BOLUS
1000.0000 mL | Freq: Once | INTRAVENOUS | Status: AC
  Administered 2022-01-08: 1000 mL via INTRAVENOUS

## 2022-01-08 MED ORDER — PROCHLORPERAZINE EDISYLATE 10 MG/2ML IJ SOLN: 10.0000 mg | Freq: Four times a day (QID) | INTRAMUSCULAR | Status: DC | PRN

## 2022-01-08 MED ORDER — SODIUM CHLORIDE 0.9 % IV BOLUS
500.0000 mL | Freq: Once | INTRAVENOUS | Status: AC
  Administered 2022-01-08: 500 mL via INTRAVENOUS

## 2022-01-08 NOTE — ED Notes (Signed)
Spoke to pt's son regarding pt's current condition. Son verbalized understanding. May call back later.

## 2022-01-08 NOTE — ED Notes (Signed)
Pt moved to er room number 18, pt states that she is starting to feel a little bit better, pt reports some rectal pain.

## 2022-01-08 NOTE — ED Notes (Signed)
Date and time results received: 01/08/22 1329 (use smartphrase ".now" to insert current time)  Test: Trop and calcium Critical Value: trop 164 and calcium 5.2  Name of Provider Notified: Trifan

## 2022-01-08 NOTE — ED Notes (Signed)
MD notified of the patient have a critical lactic at 2.3

## 2022-01-08 NOTE — H&P (Signed)
History and Physical    Patient: Jodi Evans [id number] DOB: January 18, 1951 DOA: 01/08/2022 DOS: the patient was seen and examined on 01/08/2022 PCP: Nolene Ebbs, MD  Patient coming from: Home  Chief Complaint:  Chief Complaint  Patient presents with   Diarrhea   HPI: Jodi Evans is a 71 y.o. female with medical history significant of HLD, HTN, bipolar d/o, schizophrenia. Presenting with weakness and diarrhea. She reports she was in her normal state of health until 2 days ago. She started having constant, watery diarrhea. She did not have any fevers or abdominal pains. She didn't try any medications. Yesterday she felt weak as her diarrhea continued. She was having difficulty remaining steady last night. She didn't have any chest pain, palpitations, dyspnea. This morning as her diarrhea and weakness continued, she decided to come to the ED for evaluation.  Review of Systems: As mentioned in the history of present illness. All other systems reviewed and are negative. Past Medical History:  Diagnosis Date   Bipolar 1 disorder ([place])    Hypertension    Paranoid schizophrenia ([place])    Past Surgical History:  Procedure Laterality Date   BREAST LUMPECTOMY Right 1988   Social History:  reports that she has quit smoking. She has never used smokeless tobacco. She reports that she does not drink alcohol and does not use drugs.  Allergies  Allergen Reactions   Penicillins     Has patient had a PCN reaction causing immediate rash, facial/tongue/throat swelling, SOB or lightheadedness with hypotension:denied allergy Has patient had a PCN reaction causing severe rash involving mucus membranes or skin necrosis: denied allergy Has patient had a PCN reaction that required hospitalization denied allergy Has patient had a PCN reaction occurring within the last 10 years:denied allergy If all of the above answers are "NO", then may proceed with Cephalosporin use. Patient denies a    Shellfish Allergy     Seafood-turns legs purple    History reviewed. No pertinent family history.  Prior to Admission medications   Medication Sig Start Date End Date Taking? Authorizing Provider  baclofen (LIORESAL) 10 MG tablet Take 10 mg by mouth 2 (two) times daily as needed for muscle spasms. 12/29/21  Yes [provider]  cholecalciferol (VITAMIN D3) 25 MCG (1000 UNIT) tablet Take 2,000 Units by mouth daily. 12/29/21  Yes [provider]  cyanocobalamin (VITAMIN B12) 1000 MCG tablet Take 1,000 mcg by mouth daily. 12/29/21  Yes [provider]  fluPHENAZine decanoate (PROLIXIN) 25 MG/ML injection Inject 2 mLs (50 mg total) into the muscle every 14 (fourteen) days. 03/17/15  Yes Pucilowska, Jolanta B, MD  Amantadine HCl 100 MG tablet Take 50 mg by mouth 2 (two) times daily.    [provider]  amLODipine (NORVASC) 5 MG tablet Take 1 tablet (5 mg total) by mouth daily. Patient taking differently: Take 5 mg by mouth every morning.  03/17/15   Pucilowska, Jolanta B, MD  atorvastatin (LIPITOR) 10 MG tablet Take 10 mg by mouth every morning.    [provider]  benztropine (COGENTIN) 0.5 MG tablet Take 1 tablet (0.5 mg total) by mouth daily. Patient taking differently: Take 0.5 mg by mouth 2 (two) times daily.  03/17/15   Pucilowska, Wardell Honour, MD  carbamazepine (TEGRETOL) 200 MG tablet Take 1 tablet (200 mg total) by mouth 2 (two) times daily at 8 am and 10 pm. Patient taking differently: Take 200 mg by mouth 3 (three) times daily.  03/17/15   Pucilowska,  Jolanta B, MD  fluPHENAZine (PROLIXIN) 5 MG tablet Take 1 tablet (5 mg total) by mouth 2 (two) times daily. 03/17/15   Pucilowska, Jolanta B, MD  hydrochlorothiazide (HYDRODIURIL) 25 MG tablet Take 25 mg by mouth every morning.    [provider]  PRESCRIPTION MEDICATION Apply 1 application topically 3 (three) times daily as needed (anxiety). lorazepam in lipoderm 1 ml / 0.5mg  cream -compounded   [place]    [provider]  traZODone (DESYREL) 150 MG tablet Take 1 tablet (150 mg total) by mouth at bedtime. Patient not taking: Reported on 04/08/2016 03/17/15   Pucilowska, Wardell Honour, MD  traZODone (DESYREL) 50 MG tablet Take 50 mg by mouth at bedtime.    [provider]    Physical Exam: Vitals:   01/08/22 1007 01/08/22 1008 01/08/22 1120  BP: 108/66  (!) 150/95  Pulse: 95  86  Resp: 20  19  Temp: 97.8 F (36.6 C)  (!) 97.4 F (36.3 C)  TempSrc: Oral  Oral  SpO2: 100%  99%  Weight:  77.1 kg   Height:  5\' 8"  (1.727 m)    General: 71 y.o. female resting in bed in NAD Eyes: PERRL, normal sclera ENMT: Nares patent w/o discharge, orophaynx clear, dentition normal, ears w/o discharge/lesions/ulcers Neck: Supple, trachea midline Cardiovascular: tachy, +S1, S2, no m/g/r, equal pulses throughout Respiratory: CTABL, no w/r/r, normal WOB GI: BS+, NDNT, no masses noted, no organomegaly noted MSK: No e/c/c Neuro: A&O x 3, no focal deficits Psyc: Appropriate interaction and affect, calm/cooperative  Data Reviewed:  Results for orders placed or performed during the hospital encounter of 01/08/22 (from the past 24 hour(s))  Comprehensive metabolic panel     Status: Abnormal   Collection Time: 01/08/22 10:07 AM  Result Value Ref Range   Sodium 140 135 - 145 mmol/L   Potassium 3.3 (L) 3.5 - 5.1 mmol/L   Chloride 106 98 - 111 mmol/L   CO2 14 (L) 22 - 32 mmol/L   Glucose, Bld 129 (H) 70 - 99 mg/dL   BUN 80 (H) 8 - 23 mg/dL   Creatinine, Ser 4.40 (H) 0.44 - 1.00 mg/dL   Calcium 7.8 (L) 8.9 - 10.3 mg/dL   Total Protein 8.6 (H) 6.5 - 8.1 g/dL   Albumin 3.9 3.5 - 5.0 g/dL   AST 893 (H) 15 - 41 U/L   ALT 283 (H) 0 - 44 U/L   Alkaline Phosphatase 95 38 - 126 U/L   Total Bilirubin 0.7 0.3 - 1.2 mg/dL   GFR, Estimated 10 (L) >60 mL/min   Anion gap 20 (H) 5 - 15  CBC with Differential     Status: Abnormal   Collection Time: 01/08/22 10:07 AM  Result Value  Ref Range   WBC 12.3 (H) 4.0 - 10.5 K/uL   RBC 5.34 (H) 3.87 - 5.11 MIL/uL   Hemoglobin 14.1 12.0 - 15.0 g/dL   HCT 41.6 36.0 - 46.0 %   MCV 77.9 (L) 80.0 - 100.0 fL   MCH 26.4 26.0 - 34.0 pg   MCHC 33.9 30.0 - 36.0 g/dL   RDW 14.3 11.5 - 15.5 %   Platelets 171 150 - 400 K/uL   nRBC 0.2 0.0 - 0.2 %   Neutrophils Relative % 85 %   Neutro Abs 10.4 (H) 1.7 - 7.7 K/uL   Lymphocytes Relative 8 %   Lymphs Abs 1.0 0.7 - 4.0 K/uL   Monocytes Relative 7 %   Monocytes Absolute  0.8 0.1 - 1.0 K/uL   Eosinophils Relative 0 %   Eosinophils Absolute 0.0 0.0 - 0.5 K/uL   Basophils Relative 0 %   Basophils Absolute 0.0 0.0 - 0.1 K/uL   Immature Granulocytes 0 %   Abs Immature Granulocytes 0.04 0.00 - 0.07 K/uL  Lipase, blood     Status: Abnormal   Collection Time: 01/08/22 10:07 AM  Result Value Ref Range   Lipase 66 (H) 11 - 51 U/L  Resp Panel by RT-PCR (Flu A&B, Covid) Anterior Nasal Swab     Status: Abnormal   Collection Time: 01/08/22 10:07 AM   Specimen: Anterior Nasal Swab  Result Value Ref Range   SARS Coronavirus 2 by RT PCR POSITIVE (A) NEGATIVE   Influenza A by PCR NEGATIVE NEGATIVE   Influenza B by PCR NEGATIVE NEGATIVE  Magnesium     Status: Abnormal   Collection Time: 01/08/22 10:07 AM  Result Value Ref Range   Magnesium 2.7 (H) 1.7 - 2.4 mg/dL  Troponin I (High Sensitivity)     Status: Abnormal   Collection Time: 01/08/22 10:07 AM  Result Value Ref Range   Troponin I (High Sensitivity) 238 (HH) <18 ng/L  Troponin I (High Sensitivity)     Status: Abnormal   Collection Time: 01/08/22 12:00 PM  Result Value Ref Range   Troponin I (High Sensitivity) 164 (HH) <18 ng/L  Basic metabolic panel     Status: Abnormal   Collection Time: 01/08/22 12:00 PM  Result Value Ref Range   Sodium 143 135 - 145 mmol/L   Potassium 3.7 3.5 - 5.1 mmol/L   Chloride 120 (H) 98 - 111 mmol/L   CO2 13 (L) 22 - 32 mmol/L   Glucose, Bld 76 70 - 99 mg/dL   BUN 62 (H) 8 - 23 mg/dL   Creatinine, Ser  2.97 (H) 0.44 - 1.00 mg/dL   Calcium 5.2 (LL) 8.9 - 10.3 mg/dL   GFR, Estimated 16 (L) >60 mL/min   Anion gap 10 5 - 15   CT ab/pelvis 1. No acute findings within the abdomen or pelvis. 2. Diffuse colonic diverticulosis without signs of acute diverticulitis. 3. Distended gallbladder with multiple stones. No gallbladder wall inflammation or pericholecystic fluid. 4.  Aortic Atherosclerosis (ICD10-I70.0).  CTH 1.  No acute intracranial abnormality. 2. Cerebellar atrophy 3. Moderate small vessel ischemic change.  [place] RUQ ab: Cholelithiasis and gallbladder distension without acute cholecystitis or biliary duct dilatation.  EKG: sinus, no st elevations  Assessment and Plan: COVID 19 infection Diarrhea Dehydration SIRS     - admit to inpt, tele     - her presentation of COVID seems most c/w GI-COVID     - given her renal and hepatic injury now; will start lagevrio     - fluids, anti-emetics     - check stool studies as well  Elevated LFTs     - check hepatitis panel     - imaging as above     - fluids     - hold tegretol and statin  AKI     - no evidence of obstruction     - watch nephrotoxins     - fluids  High gap metabolic acidosis     - from diarrhea     - check lactic acid     - fluids, follow  Elevated troponin     - no chest pain     - downtrending (238 -> 164)     -  EKG is non-ischemic      - monitor on tele  Hypokalemia Hypocalcemia     - replace K+/Ca2+; Mg2+ ok  HLD     - hold home regimen d/t elevated LFTs  HTN     - hold home diuretics; continue home regimen otherwise  Schizoaffective disorder     - hold her prolixin while correcting her Ca2+     - hold her tegretol d/t elevated LFTs   Advance Care Planning:   Code Status: FULL  Consults: None  Family Communication: None at bedside  Severity of Illness: The appropriate patient status for this patient is INPATIENT. Inpatient status is judged to be reasonable and necessary in order to  provide the required intensity of service to ensure the patient's safety. The patient's presenting symptoms, physical exam findings, and initial radiographic and laboratory data in the context of their chronic comorbidities is felt to place them at high risk for further clinical deterioration. Furthermore, it is not anticipated that the patient will be medically stable for discharge from the hospital within 2 midnights of admission.   * I certify that at the point of admission it is my clinical judgment that the patient will require inpatient hospital care spanning beyond 2 midnights from the point of admission due to high intensity of service, high risk for further deterioration and high frequency of surveillance required.*  Author: Teddy Spike, DO 01/08/2022 2:04 PM  For on call review [email or web address].

## 2022-01-08 NOTE — ED Notes (Signed)
Pt ambulated without assistance to bedside commode. Pt wearing non-slip footwear. Call light is within reach and was given instructions on how to call for the nurse once completed. Pt verbalized understanding.

## 2022-01-08 NOTE — ED Triage Notes (Signed)
Pt to er hallway D, pt to er via ems, per ems pt is here for confusion, weakness, hypotension and diarrhea since yesterday and chest pain since last week.  Pt awake and answering questions appropriately, pt oriented times three, pt sleepy, pt reports that she is weak and has had diarrhea since yesterday, states that she fell this am.  Pt has abrasion with scab to L elbow.  Pt moving all extremities.

## 2022-01-08 NOTE — ED Provider Notes (Signed)
[facility]-EMERGENCY DEPT Provider Note   CSN: [id number] Arrival date & time: 01/08/22  0941     History  Chief Complaint  Patient presents with   Diarrhea    Jodi Evans is a 71 y.o. female presenting from home with diarrhea, hypotension, weakness.  EMS reporting patient complained of diarrhea x 2 days.  She was low BP on their arrival.  Concern about some confusion.  Patient lives independently, son came to visit today.  Reported CP to EMS, but denies it here.   She does have dementia and schizoaffective disorder per review of her medical records.  On arrival patient has no complaints, denies HA, nausea, chest pain, SOB.  Her son Molly Maduro by phone says he visits her 2-3 times weekly, and she was doing okay 4 days ago (Monday), but 2 days ago (Wednesday) she was feeling "weak." Yesterday she had diarrhea on herself and he cleaned her up.  She couldn't stand up last night.  He reports she's been having dizziness and falls the past few weeks.  HPI     Home Medications Prior to Admission medications   Medication Sig Start Date End Date Taking? Authorizing Provider  amLODipine (NORVASC) 10 MG tablet Take 10 mg by mouth daily.   Yes [provider]  atorvastatin (LIPITOR) 10 MG tablet Take 10 mg by mouth every morning.   Yes [provider]  baclofen (LIORESAL) 10 MG tablet Take 10 mg by mouth 2 (two) times daily as needed for muscle spasms. 12/29/21  Yes [provider]  benztropine (COGENTIN) 0.5 MG tablet Take 1 tablet (0.5 mg total) by mouth daily. Patient taking differently: Take 0.5 mg by mouth 2 (two) times daily. 03/17/15  Yes Pucilowska, Jolanta B, MD  carbamazepine (TEGRETOL) 200 MG tablet Take 1 tablet (200 mg total) by mouth 2 (two) times daily at 8 am and 10 pm. Patient taking differently: Take 200 mg by mouth 3 (three) times daily. 03/17/15  Yes Pucilowska, Jolanta B, MD  cyanocobalamin (VITAMIN B12) 1000 MCG tablet  Take 1,000 mcg by mouth daily. 12/29/21  Yes [provider]  fluPHENAZine (PROLIXIN) 5 MG tablet Take 1 tablet (5 mg total) by mouth 2 (two) times daily. 03/17/15  Yes Pucilowska, Jolanta B, MD  fluPHENAZine decanoate (PROLIXIN) 25 MG/ML injection Inject 2 mLs (50 mg total) into the muscle every 14 (fourteen) days. 03/17/15  Yes Pucilowska, Jolanta B, MD  folic acid (FOLVITE) 1 MG tablet Take 1 mg by mouth daily. 12/28/21  Yes [provider]  hydrochlorothiazide (HYDRODIURIL) 12.5 MG tablet Take 12.5 mg by mouth daily.   Yes [provider]  meloxicam (MOBIC) 15 MG tablet Take 15 mg by mouth daily. 12/29/21  Yes [provider]  Vitamin D, Ergocalciferol, (DRISDOL) 1.25 MG (50000 UNIT) CAPS capsule Take 50,000 Units by mouth once a week. 12/29/21  Yes [provider]  Amantadine HCl 100 MG tablet Take 50 mg by mouth 2 (two) times daily.    [provider]      Allergies    Penicillins and Shellfish allergy    Review of Systems   Review of Systems  Physical Exam Updated Vital Signs BP (!) 158/127 (BP Location: Left Arm)   Pulse 85   Temp (!) 97.4 F (36.3 C) (Oral)   Resp 20   Ht 5\' 8"  (1.727 m)   Wt 77.1 kg   SpO2 98%   BMI 25.85 kg/m  Physical Exam Constitutional:  General: She is not in acute distress. HENT:     Head: Normocephalic and atraumatic.  Eyes:     Conjunctiva/sclera: Conjunctivae normal.     Pupils: Pupils are equal, round, and reactive to light.  Cardiovascular:     Rate and Rhythm: Normal rate and regular rhythm.  Pulmonary:     Effort: Pulmonary effort is normal. No respiratory distress.  Abdominal:     General: There is no distension.     Tenderness: There is no abdominal tenderness.  Skin:    General: Skin is warm and dry.  Neurological:     General: No focal deficit present.     Mental Status: She is alert. Mental status is at baseline.     ED Results / Procedures / Treatments   Labs (all  labs ordered are listed, but only abnormal results are displayed) Labs Reviewed  RESP PANEL BY RT-PCR (FLU A&B, COVID) ARPGX2 - Abnormal; Notable for the following components:      Result Value   SARS Coronavirus 2 by RT PCR POSITIVE (*)    All other components within normal limits  COMPREHENSIVE METABOLIC PANEL - Abnormal; Notable for the following components:   Potassium 3.3 (*)    CO2 14 (*)    Glucose, Bld 129 (*)    BUN 80 (*)    Creatinine, Ser 4.40 (*)    Calcium 7.8 (*)    Total Protein 8.6 (*)    AST 893 (*)    ALT 283 (*)    GFR, Estimated 10 (*)    Anion gap 20 (*)    All other components within normal limits  CBC WITH DIFFERENTIAL/PLATELET - Abnormal; Notable for the following components:   WBC 12.3 (*)    RBC 5.34 (*)    MCV 77.9 (*)    Neutro Abs 10.4 (*)    All other components within normal limits  LIPASE, BLOOD - Abnormal; Notable for the following components:   Lipase 66 (*)    All other components within normal limits  MAGNESIUM - Abnormal; Notable for the following components:   Magnesium 2.7 (*)    All other components within normal limits  BASIC METABOLIC PANEL - Abnormal; Notable for the following components:   Chloride 120 (*)    CO2 13 (*)    BUN 62 (*)    Creatinine, Ser 2.97 (*)    Calcium 5.2 (*)    GFR, Estimated 16 (*)    All other components within normal limits  TROPONIN I (HIGH SENSITIVITY) - Abnormal; Notable for the following components:   Troponin I (High Sensitivity) 238 (*)    All other components within normal limits  TROPONIN I (HIGH SENSITIVITY) - Abnormal; Notable for the following components:   Troponin I (High Sensitivity) 164 (*)    All other components within normal limits  URINALYSIS, ROUTINE W REFLEX MICROSCOPIC  COMPREHENSIVE METABOLIC PANEL    EKG EKG Interpretation  Date/Time:  Friday January 08 2022 10:27:24 EST Ventricular Rate:  97 PR Interval:  172 QRS Duration: 82 QT Interval:  382 QTC  Calculation: 485 R Axis:   1 Text Interpretation: Normal sinus rhythm Abnormal QRS-T angle, consider primary T wave abnormality Abnormal ECG When compared with ECG of 18-Apr-2015 10:40, PREVIOUS ECG IS PRESENT No significant changes Confirmed by Alvester Chou [phone]) on 01/08/2022 10:28:43 AM  Radiology US Abdomen Limited RUQ (LIVER/GB)  Result Date: 01/08/2022 CLINICAL DATA:  Abdominal pain EXAM: ULTRASOUND ABDOMEN LIMITED RIGHT UPPER QUADRANT COMPARISON:  CT of earlier today FINDINGS: Gallbladder: Multiple large gallstones of up to 2.8 cm. No wall thickening or pericholecystic fluid. Sonographic Murphy's sign was not elicited. Gallbladder distention. Common bile duct: Diameter: Normal, 3 mm Liver: No focal lesion identified. Within normal limits in parenchymal echogenicity. Portal vein is patent on color Doppler imaging with normal direction of blood flow towards the liver. Other: None. IMPRESSION: Cholelithiasis and gallbladder distension without acute cholecystitis or biliary duct dilatation. Electronically Signed   By: Jeronimo Greaves M.D.   On: 01/08/2022 13:31   CT Head Wo Contrast  Result Date: 01/08/2022 CLINICAL DATA:  Mental status changes EXAM: CT HEAD WITHOUT CONTRAST TECHNIQUE: Contiguous axial images were obtained from the base of the skull through the vertex without intravenous contrast. RADIATION DOSE REDUCTION: This exam was performed according to the departmental dose-optimization program which includes automated exposure control, adjustment of the mA and/or kV according to patient size and/or use of iterative reconstruction technique. COMPARISON:  None Available. FINDINGS: Brain: Moderate low density in the periventricular white matter likely related to small vessel disease. Age advanced cerebellar atrophy. No mass lesion, hemorrhage, hydrocephalus, acute infarct, intra-axial, or extra-axial fluid collection. Vascular: No hyperdense vessel or unexpected calcification. Skull: No  significant soft tissue swelling.  No skull fracture. Sinuses/Orbits: Normal imaged portions of the orbits and globes. Hypoplastic frontal sinuses. Clear mastoid air cells. Other: None. IMPRESSION: 1.  No acute intracranial abnormality. 2. Cerebellar atrophy 3. Moderate small vessel ischemic change. Electronically Signed   By: Jeronimo Greaves M.D.   On: 01/08/2022 12:23   CT ABDOMEN PELVIS WO CONTRAST  Result Date: 01/08/2022 CLINICAL DATA:  Nausea/vomiting. Evaluate for diverticulitis or colitis. EXAM: CT ABDOMEN AND PELVIS WITHOUT CONTRAST TECHNIQUE: Multidetector CT imaging of the abdomen and pelvis was performed following the standard protocol without IV contrast. RADIATION DOSE REDUCTION: This exam was performed according to the departmental dose-optimization program which includes automated exposure control, adjustment of the mA and/or kV according to patient size and/or use of iterative reconstruction technique. COMPARISON:  None Available. FINDINGS: Lower chest: No acute abnormality. Hepatobiliary: No focal liver abnormality. The gallbladder appears distended and is filled with multiple stones. No gallbladder wall inflammation or pericholecystic fluid. No signs of bile duct dilatation. Pancreas: Unremarkable. No pancreatic ductal dilatation or surrounding inflammatory changes. Spleen: Normal in size without focal abnormality. Adrenals/Urinary Tract: Normal adrenal glands. No nephrolithiasis or hydronephrosis or mass. Urinary bladder appears normal. Stomach/Bowel: Stomach appears normal. The appendix is visualized and appears normal. No small bowel wall thickening, inflammation or distension. Diffuse colonic diverticulosis identified. No significant pericolonic fat stranding. No significant bowel wall thickening identified. No signs of acute diverticulitis. Vascular/Lymphatic: Aortic atherosclerotic calcifications. No aneurysm. No signs of abdominopelvic adenopathy. Reproductive: Uterus and bilateral adnexa  are unremarkable. Other: No free fluid or fluid collections. No signs of pneumoperitoneum. Musculoskeletal: Degenerative disc disease identified at L3-4 through L5-S1. No acute or suspicious osseous findings. IMPRESSION: 1. No acute findings within the abdomen or pelvis. 2. Diffuse colonic diverticulosis without signs of acute diverticulitis. 3. Distended gallbladder with multiple stones. No gallbladder wall inflammation or pericholecystic fluid. 4.  Aortic Atherosclerosis (ICD10-I70.0). Electronically Signed   By: Signa Kell M.D.   On: 01/08/2022 12:18    Procedures Procedures    Medications Ordered in ED Medications  calcium gluconate 1 g/ 50 mL sodium chloride IVPB (1,000 mg Intravenous New Bag/Given 01/08/22 1414)  lactated ringers infusion (has no administration in time range)  sodium chloride 0.9 % bolus 1,000 mL (1,000 mLs  Intravenous New Bag/Given 01/08/22 1020)    ED Course/ Medical Decision Making/ A&P Clinical Course as of 01/08/22 1423  Fri Jan 08, 2022  1306 SARS Coronavirus 2 by RT PCR(!): POSITIVE [MT]    Clinical Course User Index [MT] Renaye Rakersrifan, Kermit BaloMatthew J, MD                           Medical Decision Making Amount and/or Complexity of Data Reviewed Labs: ordered. Decision-making details documented in ED Course. Radiology: ordered. ECG/medicine tests: ordered.  Risk Prescription drug management. Decision regarding hospitalization.   This patient presents to the ED with concern for hypotension, diarrhea. This involves an extensive number of treatment options, and is a complaint that carries with it a high risk of complications and morbidity.  The differential diagnosis includes dehydration vs UTI vs metabolic derangement vs other  Additional history obtained from EMS, patient's son by phone  External records from outside source obtained and reviewed including PDMP queried no recent narcotic prescriptions  I ordered and personally interpreted labs.  The  pertinent results include:  BUN elevated, Cr with new AKI Cr 4.4 -> 2.97, trop 238 -> 164. Covid positive.  Anion gap closed with fluids.  I suspect she is experiencing some multiorgan injury, including mild NSTEMI from demand ischemia from dehydration and COVID-19.  This would include an AKI and transaminitis.  She does not have abdominal discomfort or right upper quadrant tenderness, or Murphy sign.  However she was noted to have gallstones and distended gallbladder on CT, and I subsequently ordered right upper quadrant ultrasound.  This did not show any additional stigmata of acute cholecystitis.  Overall I suspect her symptoms are consistent with COVID viral illness, now with acute biliary obstruction or cholecystitis, did not feel she needed an emergent surgical consult.  I ordered imaging studies including CTH, CT abdomen pelvis without contrast (given poor renal function) I independently visualized and interpreted imaging which showed gallstones and diverticulosis I agree with the radiologist interpretation  The patient was maintained on a cardiac monitor.  I personally viewed and interpreted the cardiac monitored which showed an underlying rhythm of: Normal sinus rhythm  Per my interpretation the patient's ECG shows NSR no acute ischemic findings.  I ordered medication including IV fluids for hydration, IV calcium for hypocalcemia  I have reviewed the patients home medicines and have made adjustments as needed   After the interventions noted above, I reevaluated the patient and found that they have: improved  Blood pressure improved.  Kidney function improved after fluids.  Social Determinants of Health: patient does have some early dementia as well as behavioral and mental health issues, including schizoaffective disorder  Dispostion:  After consideration of the diagnostic results and the patients response to treatment, I feel that the patent would benefit from medical  admission.  Jodi Evans was evaluated in Emergency Department on 01/08/2022 for the symptoms described in the history of present illness. She was evaluated in the context of the global COVID-19 pandemic, which necessitated consideration that the patient might be at risk for infection with the SARS-CoV-2 virus that causes COVID-19. Institutional protocols and algorithms that pertain to the evaluation of patients at risk for COVID-19 are in a state of rapid change based on information released by regulatory bodies including the CDC and federal and state organizations. These policies and algorithms were followed during the patient's care in the ED.  Final Clinical Impression(s) / ED Diagnoses Final diagnoses:  COVID-19  AKI (acute kidney injury) (HCC)  Hypocalcemia  Calculus of gallbladder without cholecystitis without obstruction  Transaminitis    Rx / DC Orders ED Discharge Orders     None         Terald Sleeper, MD 01/08/22 1424

## 2022-01-08 NOTE — ED Notes (Signed)
Helped pt back into bed, pt did not have a bm, pt denies pain, resps even and unlabored

## 2022-01-09 ENCOUNTER — Inpatient Hospital Stay (HOSPITAL_COMMUNITY): Payer: 59

## 2022-01-09 DIAGNOSIS — N179 Acute kidney failure, unspecified: Secondary | ICD-10-CM | POA: Diagnosis not present

## 2022-01-09 DIAGNOSIS — E876 Hypokalemia: Secondary | ICD-10-CM

## 2022-01-09 DIAGNOSIS — R197 Diarrhea, unspecified: Secondary | ICD-10-CM | POA: Diagnosis not present

## 2022-01-09 DIAGNOSIS — R7989 Other specified abnormal findings of blood chemistry: Secondary | ICD-10-CM

## 2022-01-09 DIAGNOSIS — U071 COVID-19: Secondary | ICD-10-CM | POA: Diagnosis not present

## 2022-01-09 DIAGNOSIS — Z8679 Personal history of other diseases of the circulatory system: Secondary | ICD-10-CM

## 2022-01-09 LAB — COMPREHENSIVE METABOLIC PANEL
ALT: 192 U/L — ABNORMAL HIGH (ref 0–44)
AST: 519 U/L — ABNORMAL HIGH (ref 15–41)
Albumin: 2.9 g/dL — ABNORMAL LOW (ref 3.5–5.0)
Alkaline Phosphatase: 66 U/L (ref 38–126)
Anion gap: 13 (ref 5–15)
BUN: 72 mg/dL — ABNORMAL HIGH (ref 8–23)
CO2: 16 mmol/L — ABNORMAL LOW (ref 22–32)
Calcium: 7.7 mg/dL — ABNORMAL LOW (ref 8.9–10.3)
Chloride: 110 mmol/L (ref 98–111)
Creatinine, Ser: 2.57 mg/dL — ABNORMAL HIGH (ref 0.44–1.00)
GFR, Estimated: 19 mL/min — ABNORMAL LOW (ref >60)
Glucose, Bld: 99 mg/dL (ref 70–99)
Potassium: 3.2 mmol/L — ABNORMAL LOW (ref 3.5–5.1)
Sodium: 139 mmol/L (ref 135–145)
Total Bilirubin: 0.6 mg/dL (ref 0.3–1.2)
Total Protein: 6.4 g/dL — ABNORMAL LOW (ref 6.5–8.1)

## 2022-01-09 LAB — C-REACTIVE PROTEIN: CRP: 8.2 mg/dL — ABNORMAL HIGH (ref <1.0)

## 2022-01-09 LAB — CBC WITH DIFFERENTIAL/PLATELET
Abs Immature Granulocytes: 0.02 10*3/uL (ref 0.00–0.07)
Basophils Absolute: 0 10*3/uL (ref 0.0–0.1)
Basophils Relative: 0 %
Eosinophils Absolute: 0 10*3/uL (ref 0.0–0.5)
Eosinophils Relative: 0 %
HCT: 27.6 % — ABNORMAL LOW (ref 36.0–46.0)
Hemoglobin: 9.2 g/dL — ABNORMAL LOW (ref 12.0–15.0)
Immature Granulocytes: 0 %
Lymphocytes Relative: 14 %
Lymphs Abs: 1 10*3/uL (ref 0.7–4.0)
MCH: 26.6 pg (ref 26.0–34.0)
MCHC: 33.3 g/dL (ref 30.0–36.0)
MCV: 79.8 fL — ABNORMAL LOW (ref 80.0–100.0)
Monocytes Absolute: 0.6 10*3/uL (ref 0.1–1.0)
Monocytes Relative: 9 %
Neutro Abs: 5.6 10*3/uL (ref 1.7–7.7)
Neutrophils Relative %: 77 %
Platelets: 106 10*3/uL — ABNORMAL LOW (ref 150–400)
RBC: 3.46 MIL/uL — ABNORMAL LOW (ref 3.87–5.11)
RDW: 14.3 % (ref 11.5–15.5)
WBC: 7.2 10*3/uL (ref 4.0–10.5)
nRBC: 0.3 % — ABNORMAL HIGH (ref 0.0–0.2)

## 2022-01-09 LAB — FERRITIN: Ferritin: 1744 ng/mL — ABNORMAL HIGH (ref 11–307)

## 2022-01-09 LAB — MAGNESIUM: Magnesium: 2.1 mg/dL (ref 1.7–2.4)

## 2022-01-09 LAB — CK: Total CK: >50000 U/L — ABNORMAL HIGH (ref 38–234)

## 2022-01-09 LAB — LACTIC ACID, PLASMA: Lactic Acid, Venous: 1.6 mmol/L (ref 0.5–1.9)

## 2022-01-09 LAB — D-DIMER, QUANTITATIVE: D-Dimer, Quant: 2.12 ug/mL-FEU — ABNORMAL HIGH (ref 0.00–0.50)

## 2022-01-09 MED ORDER — AMLODIPINE BESYLATE 5 MG PO TABS: 10.0000 mg | ORAL_TABLET | Freq: Every day | ORAL | Status: DC

## 2022-01-09 MED ORDER — BENZTROPINE MESYLATE 0.5 MG PO TABS
0.5000 mg | ORAL_TABLET | Freq: Two times a day (BID) | ORAL | Status: DC
  Administered 2022-01-09 – 2022-01-12 (×8): 0.5 mg via ORAL
  Filled 2022-01-09 (×8): qty 1

## 2022-01-09 MED ORDER — HEPARIN SODIUM (PORCINE) 5000 UNIT/ML IJ SOLN
5000.0000 [IU] | Freq: Three times a day (TID) | INTRAMUSCULAR | Status: DC
  Administered 2022-01-09 – 2022-01-12 (×11): 5000 [IU] via SUBCUTANEOUS
  Filled 2022-01-09 (×11): qty 1

## 2022-01-09 MED ORDER — FOLIC ACID 1 MG PO TABS
1.0000 mg | ORAL_TABLET | Freq: Every day | ORAL | Status: DC
  Administered 2022-01-09 – 2022-01-12 (×4): 1 mg via ORAL
  Filled 2022-01-09 (×4): qty 1

## 2022-01-09 MED ORDER — ACETAMINOPHEN 325 MG PO TABS: 650.0000 mg | ORAL_TABLET | Freq: Four times a day (QID) | ORAL | Status: DC | PRN

## 2022-01-09 MED ORDER — VITAMIN B-12 1000 MCG PO TABS
1000.0000 ug | ORAL_TABLET | Freq: Every day | ORAL | Status: DC
  Administered 2022-01-09 – 2022-01-12 (×4): 1000 ug via ORAL
  Filled 2022-01-09 (×4): qty 1

## 2022-01-09 MED ORDER — POTASSIUM CHLORIDE CRYS ER 20 MEQ PO TBCR
40.0000 meq | EXTENDED_RELEASE_TABLET | Freq: Once | ORAL | Status: AC
  Administered 2022-01-09: 40 meq via ORAL
  Filled 2022-01-09: qty 2

## 2022-01-09 MED ORDER — MOLNUPIRAVIR EUA 200MG CAPSULE
4.0000 | ORAL_CAPSULE | Freq: Two times a day (BID) | ORAL | Status: DC
  Administered 2022-01-09 – 2022-01-12 (×8): 800 mg via ORAL
  Filled 2022-01-09: qty 4

## 2022-01-09 NOTE — ED Notes (Signed)
Assisted the patient to the bedside commode.  

## 2022-01-09 NOTE — ED Notes (Signed)
Pt in bed, pt reports rectal pain, pt states that pain is worse when she moves around, pt helped back into bed from bedside commode.

## 2022-01-09 NOTE — ED Notes (Signed)
ED TO INPATIENT HANDOFF REPORT  ED Nurse Name and Phone #: Ned Clines Name/Age/Gender Jodi Evans 71 y.o. female Room/Bed: WA18/WA18  Code Status   Code Status: Full Code  Home/SNF/Other Home Patient oriented to: self, place, and time Is this baseline? Yes   Triage Complete: Triage complete  Chief Complaint COVID-19 [U07.1]  Triage Note Pt to er hallway D, pt to er via ems, per ems pt is here for confusion, weakness, hypotension and diarrhea since yesterday and chest pain since last week.  Pt awake and answering questions appropriately, pt oriented times three, pt sleepy, pt reports that she is weak and has had diarrhea since yesterday, states that she fell this am.  Pt has abrasion with scab to L elbow.  Pt moving all extremities.    Allergies Allergies  Allergen Reactions   Penicillins     Has patient had a PCN reaction causing immediate rash, facial/tongue/throat swelling, SOB or lightheadedness with hypotension:denied allergy Has patient had a PCN reaction causing severe rash involving mucus membranes or skin necrosis: denied allergy Has patient had a PCN reaction that required hospitalization denied allergy Has patient had a PCN reaction occurring within the last 10 years:denied allergy If all of the above answers are "NO", then may proceed with Cephalosporin use. Patient denies a   Shellfish Allergy     Seafood-turns legs purple    Level of Care/Admitting Diagnosis ED Disposition     ED Disposition  Admit   Condition  --   Comment  Hospital Area: [facility] [[id number]]  Level of Care: Telemetry [5]  Admit to tele based on following criteria: Monitor for Ischemic changes  May admit patient to [facility] or [facility] if equivalent level of care is available:: No  Covid Evaluation: Asymptomatic - no recent exposure (last 10 days) testing not required  Diagnosis: COVID-19 [[id number]]  Admitting Physician: Teddy Spike [[id number]]   Attending Physician: Teddy Spike [[id number]]  Certification:: I certify this patient will need inpatient services for at least 2 midnights  Estimated Length of Stay: 2          B Medical/Surgery History Past Medical History:  Diagnosis Date   Bipolar 1 disorder (HCC)    Hypertension    Paranoid schizophrenia (HCC)    Past Surgical History:  Procedure Laterality Date   BREAST LUMPECTOMY Right 1988     A IV Location/Drains/Wounds Patient Lines/Drains/Airways Status     Active Line/Drains/Airways     Name Placement date Placement time Site Days   Peripheral IV 01/08/22 22 G Posterior;Right Hand 01/08/22  1006  Hand  1   Peripheral IV 01/09/22 20 G Right Antecubital 01/09/22  0210  Antecubital  less than 1            Intake/Output Last 24 hours  Intake/Output Summary (Last 24 hours) at 01/09/2022 1244 Last data filed at 01/09/2022 0132 Gross per 24 hour  Intake 2527.27 ml  Output --  Net 2527.27 ml    Labs/Imaging Results for orders placed or performed during the hospital encounter of 01/08/22 (from the past 48 hour(s))  Comprehensive metabolic panel     Status: Abnormal   Collection Time: 01/08/22 10:07 AM  Result Value Ref Range   Sodium 140 135 - 145 mmol/L   Potassium 3.3 (L) 3.5 - 5.1 mmol/L   Chloride 106 98 - 111 mmol/L   CO2 14 (L) 22 - 32 mmol/L   Glucose, Bld 129 (H) 70 -  99 mg/dL    Comment: Glucose reference range applies only to samples taken after fasting for at least 8 hours.   BUN 80 (H) 8 - 23 mg/dL   Creatinine, Ser 1.61 (H) 0.44 - 1.00 mg/dL   Calcium 7.8 (L) 8.9 - 10.3 mg/dL   Total Protein 8.6 (H) 6.5 - 8.1 g/dL   Albumin 3.9 3.5 - 5.0 g/dL   AST 096 (H) 15 - 41 U/L   ALT 283 (H) 0 - 44 U/L   Alkaline Phosphatase 95 38 - 126 U/L   Total Bilirubin 0.7 0.3 - 1.2 mg/dL   GFR, Estimated 10 (L) >60 mL/min    Comment: (NOTE) Calculated using the CKD-EPI Creatinine Equation (2021)    Anion gap 20 (H) 5 - 15    Comment: Performed at  [facility], [address] [place], [place] [address]  CBC with Differential     Status: Abnormal   Collection Time: 01/08/22 10:07 AM  Result Value Ref Range   WBC 12.3 (H) 4.0 - 10.5 K/uL   RBC 5.34 (H) 3.87 - 5.11 MIL/uL   Hemoglobin 14.1 12.0 - 15.0 g/dL   HCT 98.1 19.1 - 47.8 %   MCV 77.9 (L) 80.0 - 100.0 fL   MCH 26.4 26.0 - 34.0 pg   MCHC 33.9 30.0 - 36.0 g/dL   RDW 29.5 62.1 - 30.8 %   Platelets 171 150 - 400 K/uL   nRBC 0.2 0.0 - 0.2 %   Neutrophils Relative % 85 %   Neutro Abs 10.4 (H) 1.7 - 7.7 K/uL   Lymphocytes Relative 8 %   Lymphs Abs 1.0 0.7 - 4.0 K/uL   Monocytes Relative 7 %   Monocytes Absolute 0.8 0.1 - 1.0 K/uL   Eosinophils Relative 0 %   Eosinophils Absolute 0.0 0.0 - 0.5 K/uL   Basophils Relative 0 %   Basophils Absolute 0.0 0.0 - 0.1 K/uL   Immature Granulocytes 0 %   Abs Immature Granulocytes 0.04 0.00 - 0.07 K/uL    Comment: Performed at [facility], [address], [place], [place] [address]  Lipase, blood     Status: Abnormal   Collection Time: 01/08/22 10:07 AM  Result Value Ref Range   Lipase 66 (H) 11 - 51 U/L    Comment: Performed at [facility], [address], [place], [place] [address]  Resp Panel by RT-PCR (Flu A&B, Covid) Anterior Nasal Swab     Status: Abnormal   Collection Time: 01/08/22 10:07 AM   Specimen: Anterior Nasal Swab  Result Value Ref Range   SARS Coronavirus 2 by RT PCR POSITIVE (A) NEGATIVE    Comment: (NOTE) SARS-CoV-2 target nucleic acids are DETECTED.  The SARS-CoV-2 RNA is generally detectable in upper respiratory specimens during the acute phase of infection. Positive results are indicative of the presence of the identified virus, but do not rule out bacterial infection or co-infection with other pathogens not detected by the test. Clinical correlation with patient history and other diagnostic information is necessary to determine patient infection  status. The expected result is Negative.  Fact Sheet for Patients: BloggerCourse.com  Fact Sheet for Healthcare Providers: SeriousBroker.it  This test is not yet approved or cleared by the [place] FDA and  has been authorized for detection and/or diagnosis of SARS-CoV-2 by FDA under an Emergency Use Authorization (EUA).  This EUA will remain in effect (meaning this test can be used) for the duration of  the COVID-19 declaration under Section 564(b)(1) of the A ct, 21 U.S.C. section 360bbb-3(b)(1), unless the authorization is terminated or revoked sooner.     Influenza A by PCR NEGATIVE NEGATIVE   Influenza B by PCR NEGATIVE NEGATIVE    Comment: (NOTE) The Xpert Xpress SARS-CoV-2/FLU/RSV plus assay is intended as an aid in the diagnosis of influenza from Nasopharyngeal swab specimens and should not be used as a sole basis for treatment. Nasal washings and aspirates are unacceptable for Xpert Xpress SARS-CoV-2/FLU/RSV testing.  Fact Sheet for Patients: BloggerCourse.comhttps://www.fda.gov/media/152166/download  Fact Sheet for Healthcare Providers: SeriousBroker.ithttps://www.fda.gov/media/152162/download  This test is not yet approved or cleared by the [place] FDA and has been authorized for detection and/or diagnosis of SARS-CoV-2 by FDA under an Emergency Use Authorization (EUA). This EUA will remain in effect (meaning this test can be used) for the duration of the COVID-19 declaration under Section 564(b)(1) of the Act, 21 U.S.C. section 360bbb-3(b)(1), unless the authorization is terminated or revoked.  Performed at [facility], [address] [place], [place] [address]   Magnesium     Status: Abnormal   Collection Time: 01/08/22 10:07 AM  Result Value Ref Range   Magnesium 2.7 (H) 1.7 - 2.4 mg/dL    Comment: Performed at [facility], [address] [place], [place] [address]  Troponin I (High  Sensitivity)     Status: Abnormal   Collection Time: 01/08/22 10:07 AM  Result Value Ref Range   Troponin I (High Sensitivity) 238 (HH) <18 ng/L    Comment: CRITICAL RESULT CALLED TO, READ BACK BY AND VERIFIED WITH DANIEL, K. RN AT 1129 ON 01/08/2022 BY MECIAL J. (NOTE) Elevated high sensitivity troponin I (hsTnI) values and significant  changes across serial measurements may suggest ACS but many other  chronic and acute conditions are known to elevate hsTnI results.  Refer to the "Links" section for chest pain algorithms and additional  guidance. Performed at [facility], [address] [place], [place] [address]   Troponin I (High Sensitivity)     Status: Abnormal   Collection Time: 01/08/22 12:00 PM  Result Value Ref Range   Troponin I (High Sensitivity) 164 (HH) <18 ng/L    Comment: CRITICAL RESULT CALLED TO, READ BACK BY AND VERIFIED WITH DEZELTE, T @ 1327 ON 01/08/2022 BY XIONG, K (NOTE) Elevated high sensitivity troponin I (hsTnI) values and significant  changes across serial measurements may suggest ACS but many other  chronic and acute conditions are known to elevate hsTnI results.  Refer to the "Links" section for chest pain algorithms and additional  guidance. Performed at [facility], [address] [place], [place] [address]   Basic metabolic panel     Status: Abnormal   Collection Time: 01/08/22 12:00 PM  Result Value Ref Range   Sodium 143 135 - 145 mmol/L   Potassium 3.7 3.5 - 5.1 mmol/L    Comment: HEMOLYSIS AT THIS LEVEL MAY AFFECT RESULT   Chloride 120 (H) 98 - 111 mmol/L   CO2 13 (L) 22 - 32 mmol/L   Glucose, Bld 76 70 - 99 mg/dL    Comment: Glucose reference range applies only to samples taken after fasting for at least 8 hours.   BUN 62 (H) 8 - 23 mg/dL   Creatinine, Ser 0.272.97 (H) 0.44 - 1.00 mg/dL   Calcium 5.2 (LL) 8.9 - 10.3 mg/dL    Comment: CRITICAL RESULT CALLED TO, READ BACK BY AND VERIFIED  WITH DEZELTE,T RN @1327  ON 01/08/2022 BY XIONG, K    GFR, Estimated 16 (L) >60 mL/min    Comment: (NOTE) Calculated using the CKD-EPI Creatinine Equation (2021)    Anion gap 10 5 - 15    Comment: Performed at [facility], [address] [place], [place]  Comprehensive metabolic panel     Status: Abnormal   Collection Time: 01/08/22  4:46 PM  Result Value Ref Range   Sodium 141 135 - 145 mmol/L   Potassium 3.3 (L) 3.5 - 5.1 mmol/L   Chloride 110 98 - 111 mmol/L   CO2 16 (L) 22 - 32 mmol/L   Glucose, Bld 122 (H) 70 - 99 mg/dL    Comment: Glucose reference range applies only to samples taken after fasting for at least 8 hours.   BUN 81 (H) 8 - 23 mg/dL   Creatinine, Ser 13/10/23 (H) 0.44 - 1.00 mg/dL   Calcium 7.9 (L) 8.9 - 10.3 mg/dL    Comment: DELTA CHECK NOTED   Total Protein 7.7 6.5 - 8.1 g/dL   Albumin 3.6 3.5 - 5.0 g/dL   AST 3.82 (H) 15 - 41 U/L   ALT 266 (H) 0 - 44 U/L   Alkaline Phosphatase 91 38 - 126 U/L   Total Bilirubin 0.9 0.3 - 1.2 mg/dL   GFR, Estimated 12 (L) >60 mL/min    Comment: (NOTE) Calculated using the CKD-EPI Creatinine Equation (2021)    Anion gap 15 5 - 15    Comment: Performed at [facility], [address], [place], [place]  Hepatitis panel, acute     Status: None   Collection Time: 01/08/22  4:46 PM  Result Value Ref Range   Hepatitis B Surface Ag NON REACTIVE NON REACTIVE   HCV Ab NON REACTIVE NON REACTIVE    Comment: (NOTE) Nonreactive HCV antibody screen is consistent with no HCV infections,  unless recent infection is suspected or other evidence exists to indicate HCV infection.     Hep A IgM NON REACTIVE NON REACTIVE   Hep B C IgM NON REACTIVE NON REACTIVE    Comment: Performed at [facility], [address] [place], [place]  Lactic acid, plasma     Status: None   Collection Time: 01/08/22  4:46 PM  Result Value Ref Range   Lactic Acid, Venous 1.7 0.5 - 1.9  mmol/L    Comment: Performed at [facility], [address] [place], [place]  Urinalysis, Routine w reflex microscopic     Status: Abnormal   Collection Time: 01/08/22  6:56 PM  Result Value Ref Range   Color, Urine YELLOW YELLOW   APPearance HAZY (A) CLEAR   Specific Gravity, Urine 1.013 1.005 - 1.030   pH 5.0 5.0 - 8.0   Glucose, UA NEGATIVE NEGATIVE mg/dL   Hgb urine dipstick LARGE (A) NEGATIVE   Bilirubin Urine NEGATIVE NEGATIVE   Ketones, ur NEGATIVE NEGATIVE mg/dL   Protein, ur 30 (A) NEGATIVE mg/dL   Nitrite NEGATIVE NEGATIVE   Leukocytes,Ua MODERATE (A) NEGATIVE   RBC / HPF 0-5 0 - 5 RBC/hpf   WBC, UA 11-20 0 - 5 WBC/hpf   Bacteria, UA RARE (A) NONE SEEN   Squamous Epithelial / LPF 6-10 0 - 5   Mucus PRESENT    Hyaline Casts, UA PRESENT     Comment: Performed at [facility], [address] [place], [place]  [address]  Lactic acid, plasma     Status: Abnormal   Collection Time: 01/08/22  7:59 PM  Result Value Ref Range   Lactic Acid, Venous 2.3 (HH) 0.5 - 1.9 mmol/L    Comment: CRITICAL RESULT CALLED TO, READ BACK BY AND VERIFIED WITH GRAVELY,C AT 2137 ON 01/08/22 BY LUZOLOP Performed at [facility], [address] [place], [place] [address]   Lactic acid, plasma     Status: None   Collection Time: 01/09/22  1:49 AM  Result Value Ref Range   Lactic Acid, Venous 1.6 0.5 - 1.9 mmol/L    Comment: Performed at [facility], [address] [place], [place] [address]  CBC with Differential/Platelet     Status: Abnormal   Collection Time: 01/09/22  4:40 AM  Result Value Ref Range   WBC 7.2 4.0 - 10.5 K/uL   RBC 3.46 (L) 3.87 - 5.11 MIL/uL   Hemoglobin 9.2 (L) 12.0 - 15.0 g/dL    Comment: REPEATED TO VERIFY   HCT 27.6 (L) 36.0 - 46.0 %   MCV 79.8 (L) 80.0 - 100.0 fL   MCH 26.6 26.0 - 34.0 pg   MCHC 33.3 30.0 - 36.0 g/dL   RDW 26.8 34.1 - 96.2 %   Platelets 106 (L) 150 - 400 K/uL     Comment: SPECIMEN CHECKED FOR CLOTS REPEATED TO VERIFY PLATELET COUNT CONFIRMED BY SMEAR    nRBC 0.3 (H) 0.0 - 0.2 %   Neutrophils Relative % 77 %   Neutro Abs 5.6 1.7 - 7.7 K/uL   Lymphocytes Relative 14 %   Lymphs Abs 1.0 0.7 - 4.0 K/uL   Monocytes Relative 9 %   Monocytes Absolute 0.6 0.1 - 1.0 K/uL   Eosinophils Relative 0 %   Eosinophils Absolute 0.0 0.0 - 0.5 K/uL   Basophils Relative 0 %   Basophils Absolute 0.0 0.0 - 0.1 K/uL   Immature Granulocytes 0 %   Abs Immature Granulocytes 0.02 0.00 - 0.07 K/uL    Comment: Performed at [facility], [address], [place], [place] [address]  Comprehensive metabolic panel     Status: Abnormal   Collection Time: 01/09/22  4:40 AM  Result Value Ref Range   Sodium 139 135 - 145 mmol/L   Potassium 3.2 (L) 3.5 - 5.1 mmol/L   Chloride 110 98 - 111 mmol/L   CO2 16 (L) 22 - 32 mmol/L   Glucose, Bld 99 70 - 99 mg/dL    Comment: Glucose reference range applies only to samples taken after fasting for at least 8 hours.   BUN 72 (H) 8 - 23 mg/dL   Creatinine, Ser 8.92 (H) 0.44 - 1.00 mg/dL   Calcium 7.7 (L) 8.9 - 10.3 mg/dL   Total Protein 6.4 (L) 6.5 - 8.1 g/dL   Albumin 2.9 (L) 3.5 - 5.0 g/dL   AST 119 (H) 15 - 41 U/L   ALT 192 (H) 0 - 44 U/L   Alkaline Phosphatase 66 38 - 126 U/L   Total Bilirubin 0.6 0.3 - 1.2 mg/dL   GFR, Estimated 19 (L) >60 mL/min    Comment: (NOTE) Calculated using the CKD-EPI Creatinine Equation (2021)    Anion gap 13 5 - 15    Comment: Performed at [facility], [address], [place], [place] [address]  C-reactive protein     Status: Abnormal   Collection Time: 01/09/22  4:40 AM  Result Value Ref Range  CRP 8.2 (H) <1.0 mg/dL    Comment: Performed at [facility], [address] [place], [place] [address]  D-dimer, quantitative     Status: Abnormal   Collection Time: 01/09/22  4:40 AM  Result Value Ref Range   D-Dimer, Quant 2.12 (H) 0.00 - 0.50  ug/mL-FEU    Comment: (NOTE) At the manufacturer cut-off value of 0.5 g/mL FEU, this assay has a negative predictive value of 95-100%.This assay is intended for use in conjunction with a clinical pretest probability (PTP) assessment model to exclude pulmonary embolism (PE) and deep venous thrombosis (DVT) in outpatients suspected of PE or DVT. Results should be correlated with clinical presentation. Performed at [facility], [address] [place], [place] [address]   Ferritin     Status: Abnormal   Collection Time: 01/09/22  4:40 AM  Result Value Ref Range   Ferritin 1,744 (H) 11 - 307 ng/mL    Comment: Performed at [facility], [address], [place], [place] [address]   US Abdomen Limited RUQ (LIVER/GB)  Result Date: 01/08/2022 CLINICAL DATA:  Abdominal pain EXAM: ULTRASOUND ABDOMEN LIMITED RIGHT UPPER QUADRANT COMPARISON:  CT of earlier today FINDINGS: Gallbladder: Multiple large gallstones of up to 2.8 cm. No wall thickening or pericholecystic fluid. Sonographic Murphy's sign was not elicited. Gallbladder distention. Common bile duct: Diameter: Normal, 3 mm Liver: No focal lesion identified. Within normal limits in parenchymal echogenicity. Portal vein is patent on color Doppler imaging with normal direction of blood flow towards the liver. Other: None. IMPRESSION: Cholelithiasis and gallbladder distension without acute cholecystitis or biliary duct dilatation. Electronically Signed   By: Jeronimo Greaves M.D.   On: 01/08/2022 13:31   CT Head Wo Contrast  Result Date: 01/08/2022 CLINICAL DATA:  Mental status changes EXAM: CT HEAD WITHOUT CONTRAST TECHNIQUE: Contiguous axial images were obtained from the base of the skull through the vertex without intravenous contrast. RADIATION DOSE REDUCTION: This exam was performed according to the departmental dose-optimization program which includes automated exposure control, adjustment of the mA and/or kV  according to patient size and/or use of iterative reconstruction technique. COMPARISON:  None Available. FINDINGS: Brain: Moderate low density in the periventricular white matter likely related to small vessel disease. Age advanced cerebellar atrophy. No mass lesion, hemorrhage, hydrocephalus, acute infarct, intra-axial, or extra-axial fluid collection. Vascular: No hyperdense vessel or unexpected calcification. Skull: No significant soft tissue swelling.  No skull fracture. Sinuses/Orbits: Normal imaged portions of the orbits and globes. Hypoplastic frontal sinuses. Clear mastoid air cells. Other: None. IMPRESSION: 1.  No acute intracranial abnormality. 2. Cerebellar atrophy 3. Moderate small vessel ischemic change. Electronically Signed   By: Jeronimo Greaves M.D.   On: 01/08/2022 12:23   CT ABDOMEN PELVIS WO CONTRAST  Result Date: 01/08/2022 CLINICAL DATA:  Nausea/vomiting. Evaluate for diverticulitis or colitis. EXAM: CT ABDOMEN AND PELVIS WITHOUT CONTRAST TECHNIQUE: Multidetector CT imaging of the abdomen and pelvis was performed following the standard protocol without IV contrast. RADIATION DOSE REDUCTION: This exam was performed according to the departmental dose-optimization program which includes automated exposure control, adjustment of the mA and/or kV according to patient size and/or use of iterative reconstruction technique. COMPARISON:  None Available. FINDINGS: Lower chest: No acute abnormality. Hepatobiliary: No focal liver abnormality. The gallbladder appears distended and is filled with multiple stones. No gallbladder wall inflammation or pericholecystic fluid. No signs of bile duct dilatation. Pancreas: Unremarkable. No pancreatic ductal dilatation or surrounding inflammatory changes. Spleen: Normal in size  without focal abnormality. Adrenals/Urinary Tract: Normal adrenal glands. No nephrolithiasis or hydronephrosis or mass. Urinary bladder appears normal. Stomach/Bowel: Stomach appears normal.  The appendix is visualized and appears normal. No small bowel wall thickening, inflammation or distension. Diffuse colonic diverticulosis identified. No significant pericolonic fat stranding. No significant bowel wall thickening identified. No signs of acute diverticulitis. Vascular/Lymphatic: Aortic atherosclerotic calcifications. No aneurysm. No signs of abdominopelvic adenopathy. Reproductive: Uterus and bilateral adnexa are unremarkable. Other: No free fluid or fluid collections. No signs of pneumoperitoneum. Musculoskeletal: Degenerative disc disease identified at L3-4 through L5-S1. No acute or suspicious osseous findings. IMPRESSION: 1. No acute findings within the abdomen or pelvis. 2. Diffuse colonic diverticulosis without signs of acute diverticulitis. 3. Distended gallbladder with multiple stones. No gallbladder wall inflammation or pericholecystic fluid. 4.  Aortic Atherosclerosis (ICD10-I70.0). Electronically Signed   By: Signa Kell M.D.   On: 01/08/2022 12:18    Pending Labs Unresulted Labs (From admission, onward)     Start     Ordered   01/09/22 1243  Urine Culture  Once,   R        01/09/22 1243   01/09/22 0709  Magnesium  Add-on,   AD        01/09/22 0709   01/09/22 0708  CK  Add-on,   AD        01/09/22 0707   01/09/22 0500  CBC with Differential/Platelet  Daily at 5am,   R      01/09/22 0059   01/09/22 0500  Comprehensive metabolic panel  Daily at 5am,   R      01/09/22 0059   01/09/22 0500  C-reactive protein  Daily at 5am,   R      01/09/22 0059   01/09/22 0500  D-dimer, quantitative  Daily at 5am,   R      01/09/22 0059   01/09/22 0500  Ferritin  Daily at 5am,   R      01/09/22 0059   01/08/22 1616  Gastrointestinal Panel by PCR , Stool  (Gastrointestinal Panel by PCR, Stool                                                                                                                                                     **Does Not include CLOSTRIDIUM DIFFICILE  testing. **If CDIFF testing is needed, place order from the "C Difficile Testing" order set.**)  Once,   URGENT        01/08/22 1615            Vitals/Pain Today's Vitals   01/09/22 0800 01/09/22 0830 01/09/22 0900 01/09/22 1221  BP: 134/78 128/82 129/82 137/83  Pulse: 86 79 86 93  Resp: (!) 24 18 19  (!) 21  Temp:  98.8 F (37.1 C) 98.6 F (37 C) 98.4 F (36.9 C)  TempSrc:  Oral Oral Oral  SpO2: 95% 95% 96% 95%  Weight:      Height:      PainSc:   0-No pain 7     Isolation Precautions Airborne and Contact precautions  Medications Medications  lactated ringers infusion ( Intravenous New Bag/Given 01/09/22 1224)  prochlorperazine (COMPAZINE) injection 10 mg (has no administration in time range)  cyanocobalamin (VITAMIN B12) tablet 1,000 mcg (1,000 mcg Oral Given 01/09/22 0903)  folic acid (FOLVITE) tablet 1 mg (1 mg Oral Given 01/09/22 0903)  benztropine (COGENTIN) tablet 0.5 mg (0.5 mg Oral Given 01/09/22 0903)  heparin injection 5,000 Units (5,000 Units Subcutaneous Given 01/09/22 0427)  molnupiravir EUA (LAGEVRIO) capsule 800 mg (800 mg Oral Given 01/09/22 0904)  acetaminophen (TYLENOL) tablet 650 mg ( Oral Canceled Entry 01/09/22 0303)  sodium chloride 0.9 % bolus 1,000 mL (0 mLs Intravenous Stopped 01/08/22 1530)  calcium gluconate 1 g/ 50 mL sodium chloride IVPB (0 mg Intravenous Stopped 01/08/22 2051)  sodium chloride 0.9 % bolus 500 mL (0 mLs Intravenous Stopped 01/09/22 0040)  potassium chloride SA (KLOR-CON M) CR tablet 40 mEq (40 mEq Oral Given 01/09/22 0859)    Mobility walks with person assist Moderate fall risk      R Recommendations: See Admitting Provider Note  Report given to:

## 2022-01-09 NOTE — Progress Notes (Signed)
PROGRESS NOTE  Jodi Evans SHF:[id number] DOB: 1950/07/02 DOA: 01/08/2022 PCP: Nolene Ebbs, MD  HPI/Recap of past 24 hours: Jodi Evans is a 71 y.o. female with medical history significant of HLD, HTN, bipolar d/o, schizophrenia presenting with weakness and diarrhea. She reports she was in her normal state of health until 2 days ago, started having constant, watery diarrhea, denied any fever, abdominal pain, chest pain, palpitations, or dyspnea. In the ED, found to be covid positive.  Patient admitted for further management.    Today, patient denies any new complaints, met patient eating breakfast.  Denies any worsening diarrhea, abdominal pain, chest pain, shortness of breath, fever/chills.    Assessment/Plan: Principal Problem:   COVID-19 Active Problems:   Schizoaffective disorder, bipolar type (HCC)   SIRS (systemic inflammatory response syndrome) (HCC)   Diarrhea   Dehydration   Elevated LFTs   AKI (acute kidney injury) (HCC)   High anion gap metabolic acidosis   Elevated troponin   Hypokalemia   Hypocalcemia   HLD (hyperlipidemia)   History of hypertension   COVID 19 infection with gastroenteritis  Currently afebrile Reports resolving diarrhea Trend inflammatory markers Lactic acidosis resolved Chest x-ray pending Stool panel pending Continue lagevrio IV fluids Monitor closely  Hypokalemia Hypocalcemia Replace as needed  AKI Metabolic acidosis Likely 2/2 gastroenteritis Continue IV fluids Daily BMP  Elevated LFTs ??Rhabdo--> CK level pending Hepatitis panel negative Right upper quadrant ultrasound showed multiple large gallstones of up to 2.8 cm, no wall thickening or pericholecystic fluid CT abdomen pelvis, unremarkable, except for above Continue to hold tegretol and statin   Elevated troponin Currently chest pain-free Flat trend, downtrending (238 -> 164) EKG is non-ischemic  Echo  pending Telemetry  Hypertension Stable Hold BP meds   HLD Hold home regimen d/t elevated LFTs   Schizoaffective disorder Hold her tegretol d/t elevated LFTs       Estimated body mass index is 25.85 kg/m as calculated from the following:   Height as of this encounter: _0  (1.727 m).   Weight as of this encounter: 77.1 kg.     Code Status: Full  Family Communication: None at bedside  Disposition Plan: Status is: Inpatient Remains inpatient appropriate because: Level of care      Consultants: None  Procedures: None  Antimicrobials: None   DVT prophylaxis: Heparin [place] [facility]   Objective: Vitals:   01/09/22 0800 01/09/22 0830 01/09/22 0900 01/09/22 1221  BP: 134/78 128/82 129/82 137/83  Pulse: 86 79 86 93  Resp: (!) _1 (!) 21  Temp:  98.8 F (37.1 C) 98.6 F (37 C) 98.4 F (36.9 C)  TempSrc:  Oral Oral Oral  SpO2: 95% 95% 96% 95%  Weight:      Height:        Intake/Output Summary (Last 24 hours) at 01/09/2022 1401 Last data filed at 01/09/2022 0132 Gross per 24 hour  Intake 2527.27 ml  Output --  Net 2527.27 ml   Filed Weights   01/08/22 1008  Weight: 77.1 kg    Exam: General: NAD  Cardiovascular: S1, S2 present Respiratory: CTAB Abdomen: Soft, nontender, nondistended, bowel sounds present Musculoskeletal: No bilateral pedal edema noted Skin: Normal Psychiatry: Normal mood    Data Reviewed: CBC: Recent Labs  Lab 01/08/22 1007 01/09/22 0440  WBC 12.3* 7.2  NEUTROABS 10.4* 5.6  HGB 14.1 9.2*  HCT 41.6 27.6*  MCV 77.9* 79.8*  PLT 171 502*   Basic Metabolic Panel: Recent Labs  Lab 01/08/22 1007  01/08/22 1200 01/08/22 1646 01/09/22 0440  NA 140 143 141 139  K 3.3* 3.7 3.3* 3.2*  CL 106 120* 110 110  CO2 14* 13* 16* 16*  GLUCOSE 129* 76 122* 99  BUN 80* 62* 81* 72*  CREATININE 4.40* 2.97* 3.81* 2.57*  CALCIUM 7.8* 5.2* 7.9* 7.7*  MG 2.7*  --   --   --    GFR: Estimated Creatinine Clearance: 21.9 mL/min (A) (by C-G  formula based on SCr of 2.57 mg/dL (H)). Liver Function Tests: Recent Labs  Lab 01/08/22 1007 01/08/22 1646 01/09/22 0440  AST 893* 811* 519*  ALT 283* 266* 192*  ALKPHOS 95 91 66  BILITOT 0.7 0.9 0.6  PROT 8.6* 7.7 6.4*  ALBUMIN 3.9 3.6 2.9*   Recent Labs  Lab 01/08/22 1007  LIPASE 66*   No results for input(s): "AMMONIA" in the last 168 hours. Coagulation Profile: No results for input(s): "INR", "PROTIME" in the last 168 hours. Cardiac Enzymes: No results for input(s): "CKTOTAL", "CKMB", "CKMBINDEX", "TROPONINI" in the last 168 hours. BNP (last 3 results) No results for input(s): "PROBNP" in the last 8760 hours. HbA1C: No results for input(s): "HGBA1C" in the last 72 hours. CBG: No results for input(s): "GLUCAP" in the last 168 hours. Lipid Profile: No results for input(s): "CHOL", "HDL", "LDLCALC", "TRIG", "CHOLHDL", "LDLDIRECT" in the last 72 hours. Thyroid Function Tests: No results for input(s): "TSH", "T4TOTAL", "FREET4", "T3FREE", "THYROIDAB" in the last 72 hours. Anemia Panel: Recent Labs    01/09/22 0440  FERRITIN 1,744*   Urine analysis:    Component Value Date/Time   COLORURINE YELLOW 01/08/2022 1856   APPEARANCEUR HAZY (A) 01/08/2022 1856   LABSPEC 1.013 01/08/2022 1856   PHURINE 5.0 01/08/2022 1856   GLUCOSEU NEGATIVE 01/08/2022 1856   HGBUR LARGE (A) 01/08/2022 1856   BILIRUBINUR NEGATIVE 01/08/2022 1856   KETONESUR NEGATIVE 01/08/2022 1856   PROTEINUR 30 (A) 01/08/2022 1856   NITRITE NEGATIVE 01/08/2022 1856   LEUKOCYTESUR MODERATE (A) 01/08/2022 1856   Sepsis Labs: _0 (procalcitonin:4,lacticidven:4)  ) Recent Results (from the past 240 hour(s))  Resp Panel by RT-PCR (Flu A&B, Covid) Anterior Nasal Swab     Status: Abnormal   Collection Time: 01/08/22 10:07 AM   Specimen: Anterior Nasal Swab  Result Value Ref Range Status   SARS Coronavirus 2 by RT PCR POSITIVE (A) NEGATIVE Final    Comment: (NOTE) SARS-CoV-2 target nucleic  acids are DETECTED.  The SARS-CoV-2 RNA is generally detectable in upper respiratory specimens during the acute phase of infection. Positive results are indicative of the presence of the identified virus, but do not rule out bacterial infection or co-infection with other pathogens not detected by the test. Clinical correlation with patient history and other diagnostic information is necessary to determine patient infection status. The expected result is Negative.  Fact Sheet for Patients: EntrepreneurPulse.com.au  Fact Sheet for Healthcare Providers: IncredibleEmployment.be  This test is not yet approved or cleared by the [place] FDA and  has been authorized for detection and/or diagnosis of SARS-CoV-2 by FDA under an Emergency Use Authorization (EUA).  This EUA will remain in effect (meaning this test can be used) for the duration of  the COVID-19 declaration under Section 564(b)(1) of the A ct, 21 U.S.C. section 360bbb-3(b)(1), unless the authorization is terminated or revoked sooner.     Influenza A by PCR NEGATIVE NEGATIVE Final   Influenza B by PCR NEGATIVE NEGATIVE Final    Comment: (NOTE) The Xpert Xpress SARS-CoV-2/FLU/RSV plus assay is  intended as an aid in the diagnosis of influenza from Nasopharyngeal swab specimens and should not be used as a sole basis for treatment. Nasal washings and aspirates are unacceptable for Xpert Xpress SARS-CoV-2/FLU/RSV testing.  Fact Sheet for Patients: EntrepreneurPulse.com.au  Fact Sheet for Healthcare Providers: IncredibleEmployment.be  This test is not yet approved or cleared by the [place] FDA and has been authorized for detection and/or diagnosis of SARS-CoV-2 by FDA under an Emergency Use Authorization (EUA). This EUA will remain in effect (meaning this test can be used) for the duration of the COVID-19 declaration under Section 564(b)(1) of  the Act, 21 U.S.C. section 360bbb-3(b)(1), unless the authorization is terminated or revoked.  Performed at [facility], [place] [address] [place],  [address]       Studies: No results found.  Scheduled Meds:  benztropine  0.5 mg Oral BID   cyanocobalamin  1,000 mcg Oral Daily   folic acid  1 mg Oral Daily   heparin  5,000 Units Subcutaneous Q8H   molnupiravir EUA  4 capsule Oral BID    Continuous Infusions:  lactated ringers 100 mL/hr at 01/09/22 1224     LOS: 1 day     Alma Friendly, MD [facility]  If 7PM-7AM, please contact night-coverage [email or web address] 01/09/2022, 2:01 PM

## 2022-01-10 ENCOUNTER — Inpatient Hospital Stay (HOSPITAL_COMMUNITY): Payer: 59

## 2022-01-10 DIAGNOSIS — R7989 Other specified abnormal findings of blood chemistry: Secondary | ICD-10-CM | POA: Diagnosis not present

## 2022-01-10 DIAGNOSIS — R197 Diarrhea, unspecified: Secondary | ICD-10-CM | POA: Diagnosis not present

## 2022-01-10 DIAGNOSIS — I10 Essential (primary) hypertension: Secondary | ICD-10-CM

## 2022-01-10 DIAGNOSIS — R778 Other specified abnormalities of plasma proteins: Secondary | ICD-10-CM

## 2022-01-10 DIAGNOSIS — N179 Acute kidney failure, unspecified: Secondary | ICD-10-CM | POA: Diagnosis not present

## 2022-01-10 DIAGNOSIS — U071 COVID-19: Secondary | ICD-10-CM | POA: Diagnosis not present

## 2022-01-10 LAB — COMPREHENSIVE METABOLIC PANEL
ALT: 180 U/L — ABNORMAL HIGH (ref 0–44)
AST: 430 U/L — ABNORMAL HIGH (ref 15–41)
Albumin: 3.1 g/dL — ABNORMAL LOW (ref 3.5–5.0)
Alkaline Phosphatase: 70 U/L (ref 38–126)
Anion gap: 8 (ref 5–15)
BUN: 49 mg/dL — ABNORMAL HIGH (ref 8–23)
CO2: 21 mmol/L — ABNORMAL LOW (ref 22–32)
Calcium: 8.3 mg/dL — ABNORMAL LOW (ref 8.9–10.3)
Chloride: 112 mmol/L — ABNORMAL HIGH (ref 98–111)
Creatinine, Ser: 1.22 mg/dL — ABNORMAL HIGH (ref 0.44–1.00)
GFR, Estimated: 47 mL/min — ABNORMAL LOW (ref >60)
Glucose, Bld: 105 mg/dL — ABNORMAL HIGH (ref 70–99)
Potassium: 3.3 mmol/L — ABNORMAL LOW (ref 3.5–5.1)
Sodium: 141 mmol/L (ref 135–145)
Total Bilirubin: 0.7 mg/dL (ref 0.3–1.2)
Total Protein: 6.7 g/dL (ref 6.5–8.1)

## 2022-01-10 LAB — ECHOCARDIOGRAM COMPLETE
Area-P 1/2: 2.04 cm2
Calc EF: 47 %
Height: 68 in
MV VTI: 2.95 cm2
S' Lateral: 2.15 cm
Single Plane A2C EF: 42.7 %
Single Plane A4C EF: 50.5 %
Weight: 2720 oz

## 2022-01-10 LAB — CBC WITH DIFFERENTIAL/PLATELET
Abs Immature Granulocytes: 0.03 10*3/uL (ref 0.00–0.07)
Basophils Absolute: 0 10*3/uL (ref 0.0–0.1)
Basophils Relative: 0 %
Eosinophils Absolute: 0 10*3/uL (ref 0.0–0.5)
Eosinophils Relative: 0 %
HCT: 31.1 % — ABNORMAL LOW (ref 36.0–46.0)
Hemoglobin: 10.3 g/dL — ABNORMAL LOW (ref 12.0–15.0)
Immature Granulocytes: 0 %
Lymphocytes Relative: 14 %
Lymphs Abs: 1 10*3/uL (ref 0.7–4.0)
MCH: 26.6 pg (ref 26.0–34.0)
MCHC: 33.1 g/dL (ref 30.0–36.0)
MCV: 80.4 fL (ref 80.0–100.0)
Monocytes Absolute: 0.6 10*3/uL (ref 0.1–1.0)
Monocytes Relative: 8 %
Neutro Abs: 5.5 10*3/uL (ref 1.7–7.7)
Neutrophils Relative %: 78 %
Platelets: 145 10*3/uL — ABNORMAL LOW (ref 150–400)
RBC: 3.87 MIL/uL (ref 3.87–5.11)
RDW: 14.5 % (ref 11.5–15.5)
WBC: 7.2 10*3/uL (ref 4.0–10.5)
nRBC: 0 % (ref 0.0–0.2)

## 2022-01-10 LAB — GASTROINTESTINAL PANEL BY PCR, STOOL (REPLACES STOOL CULTURE)

## 2022-01-10 LAB — D-DIMER, QUANTITATIVE: D-Dimer, Quant: 1.79 ug/mL-FEU — ABNORMAL HIGH (ref 0.00–0.50)

## 2022-01-10 LAB — FERRITIN: Ferritin: 1079 ng/mL — ABNORMAL HIGH (ref 11–307)

## 2022-01-10 LAB — C-REACTIVE PROTEIN: CRP: 5 mg/dL — ABNORMAL HIGH (ref <1.0)

## 2022-01-10 MED ORDER — POTASSIUM CHLORIDE CRYS ER 20 MEQ PO TBCR
40.0000 meq | EXTENDED_RELEASE_TABLET | Freq: Two times a day (BID) | ORAL | Status: AC
  Administered 2022-01-10 (×2): 40 meq via ORAL
  Filled 2022-01-10 (×2): qty 2

## 2022-01-10 NOTE — Progress Notes (Signed)
PROGRESS NOTE  Jodi Evans AOZ:[id number] DOB: 10/17/1950 DOA: 01/08/2022 PCP: Fleet Contras, MD  HPI/Recap of past 24 hours: Jodi Evans is a 71 y.o. female with medical history significant of HLD, HTN, bipolar d/o, schizophrenia presenting with weakness and diarrhea. She reports she was in her normal state of health until 2 days ago, started having constant, watery diarrhea, denied any fever, abdominal pain, chest pain, palpitations, or dyspnea. In the ED, found to be covid positive.  Patient admitted for further management.    Today, pt denies any new complains. Reports some mild generalized bodyaches   Assessment/Plan: Principal Problem:   COVID-19 Active Problems:   Schizoaffective disorder, bipolar type (HCC)   SIRS (systemic inflammatory response syndrome) (HCC)   Diarrhea   Dehydration   Elevated LFTs   AKI (acute kidney injury) (HCC)   High anion gap metabolic acidosis   Elevated troponin   Hypokalemia   Hypocalcemia   HLD (hyperlipidemia)   History of hypertension   COVID 19 infection with gastroenteritis  Currently afebrile Reports resolving diarrhea Trend inflammatory markers Lactic acidosis resolved Chest x-ray unremarkable Stool panel unremarkable Continue lagevrio IV fluids Monitor closely  Hypokalemia Hypocalcemia Replace as needed  AKI Metabolic acidosis Likely 2/2 gastroenteritis Continue IV fluids Daily BMP  Elevated LFTs COVID-related rhabdomyolysis CK >50,000, will trend Hepatitis panel negative Right upper quadrant ultrasound showed multiple large gallstones of up to 2.8 cm, no wall thickening or pericholecystic fluid CT abdomen pelvis, unremarkable, except for above Continue to hold tegretol and statin IV fluids   Elevated troponin Likely 2/2 above Currently chest pain-free Flat trend, downtrending (238 -> 164) EKG is non-ischemic  Echo pending Telemetry  Hypertension Stable Hold BP meds   HLD Hold home  regimen d/t elevated LFTs   Schizoaffective disorder Hold her tegretol d/t elevated LFTs       Estimated body mass index is 25.85 kg/m as calculated from the following:   Height as of this encounter: 5\' 8"  (1.727 m).   Weight as of this encounter: 77.1 kg.     Code Status: Full  Family Communication: None at bedside  Disposition Plan: Status is: Inpatient Remains inpatient appropriate because: Level of care      Consultants: None  Procedures: None  Antimicrobials: None   DVT prophylaxis: Heparin [place]   Objective: Vitals:   01/10/22 0008 01/10/22 0357 01/10/22 0925 01/10/22 1447  BP: 129/73 (!) 141/74 (!) 159/95 134/81  Pulse: 81 80 73 79  Resp:    16  Temp: (!) 97.5 F (36.4 C) 97.9 F (36.6 C) 98 F (36.7 C) 98.4 F (36.9 C)  TempSrc: Oral Oral Oral Oral  SpO2: 99% 95% 100% 99%  Weight:      Height:        Intake/Output Summary (Last 24 hours) at 01/10/2022 1453 Last data filed at 01/10/2022 1000 Gross per 24 hour  Intake 360 ml  Output 1500 ml  Net -1140 ml   Filed Weights   01/08/22 1008  Weight: 77.1 kg    Exam: General: NAD  Cardiovascular: S1, S2 present Respiratory: CTAB Abdomen: Soft, nontender, nondistended, bowel sounds present Musculoskeletal: No bilateral pedal edema noted Skin: Normal Psychiatry: Normal mood    Data Reviewed: CBC: Recent Labs  Lab 01/08/22 1007 01/09/22 0440 01/10/22 0451  WBC 12.3* 7.2 7.2  NEUTROABS 10.4* 5.6 5.5  HGB 14.1 9.2* 10.3*  HCT 41.6 27.6* 31.1*  MCV 77.9* 79.8* 80.4  PLT 171 106* 145*   Basic Metabolic  Panel: Recent Labs  Lab 01/08/22 1007 01/08/22 1200 01/08/22 1646 01/09/22 0440 01/10/22 0451  NA 140 143 141 139 141  K 3.3* 3.7 3.3* 3.2* 3.3*  CL 106 120* 110 110 112*  CO2 14* 13* 16* 16* 21*  GLUCOSE 129* 76 122* 99 105*  BUN 80* 62* 81* 72* 49*  CREATININE 4.40* 2.97* 3.81* 2.57* 1.22*  CALCIUM 7.8* 5.2* 7.9* 7.7* 8.3*  MG 2.7*  --   --  2.1  --    GFR: Estimated  Creatinine Clearance: 46.2 mL/min (A) (by C-G formula based on SCr of 1.22 mg/dL (H)). Liver Function Tests: Recent Labs  Lab 01/08/22 1007 01/08/22 1646 01/09/22 0440 01/10/22 0451  AST 893* 811* 519* 430*  ALT 283* 266* 192* 180*  ALKPHOS 95 91 66 70  BILITOT 0.7 0.9 0.6 0.7  PROT 8.6* 7.7 6.4* 6.7  ALBUMIN 3.9 3.6 2.9* 3.1*   Recent Labs  Lab 01/08/22 1007  LIPASE 66*   No results for input(s): "AMMONIA" in the last 168 hours. Coagulation Profile: No results for input(s): "INR", "PROTIME" in the last 168 hours. Cardiac Enzymes: Recent Labs  Lab 01/09/22 0440  CKTOTAL >50,000*   BNP (last 3 results) No results for input(s): "PROBNP" in the last 8760 hours. HbA1C: No results for input(s): "HGBA1C" in the last 72 hours. CBG: No results for input(s): "GLUCAP" in the last 168 hours. Lipid Profile: No results for input(s): "CHOL", "HDL", "LDLCALC", "TRIG", "CHOLHDL", "LDLDIRECT" in the last 72 hours. Thyroid Function Tests: No results for input(s): "TSH", "T4TOTAL", "FREET4", "T3FREE", "THYROIDAB" in the last 72 hours. Anemia Panel: Recent Labs    01/09/22 0440 01/10/22 0451  FERRITIN 1,744* 1,079*   Urine analysis:    Component Value Date/Time   COLORURINE YELLOW 01/08/2022 1856   APPEARANCEUR HAZY (A) 01/08/2022 1856   LABSPEC 1.013 01/08/2022 1856   PHURINE 5.0 01/08/2022 1856   GLUCOSEU NEGATIVE 01/08/2022 1856   HGBUR LARGE (A) 01/08/2022 1856   BILIRUBINUR NEGATIVE 01/08/2022 1856   KETONESUR NEGATIVE 01/08/2022 1856   PROTEINUR 30 (A) 01/08/2022 1856   NITRITE NEGATIVE 01/08/2022 1856   LEUKOCYTESUR MODERATE (A) 01/08/2022 1856   Sepsis Labs: @LABRCNTIP (procalcitonin:4,lacticidven:4)  ) Recent Results (from the past 240 hour(s))  Resp Panel by RT-PCR (Flu A&B, Covid) Anterior Nasal Swab     Status: Abnormal   Collection Time: 01/08/22 10:07 AM   Specimen: Anterior Nasal Swab  Result Value Ref Range Status   SARS Coronavirus 2 by RT PCR  POSITIVE (A) NEGATIVE Final    Comment: (NOTE) SARS-CoV-2 target nucleic acids are DETECTED.  The SARS-CoV-2 RNA is generally detectable in upper respiratory specimens during the acute phase of infection. Positive results are indicative of the presence of the identified virus, but do not rule out bacterial infection or co-infection with other pathogens not detected by the test. Clinical correlation with patient history and other diagnostic information is necessary to determine patient infection status. The expected result is Negative.  Fact Sheet for Patients: 13/10/23  Fact Sheet for Healthcare Providers: BloggerCourse.com  This test is not yet approved or cleared by the [place] FDA and  has been authorized for detection and/or diagnosis of SARS-CoV-2 by FDA under an Emergency Use Authorization (EUA).  This EUA will remain in effect (meaning this test can be used) for the duration of  the COVID-19 declaration under Section 564(b)(1) of the A ct, 21 U.S.C. section 360bbb-3(b)(1), unless the authorization is terminated or revoked sooner.     Influenza  A by PCR NEGATIVE NEGATIVE Final   Influenza B by PCR NEGATIVE NEGATIVE Final    Comment: (NOTE) The Xpert Xpress SARS-CoV-2/FLU/RSV plus assay is intended as an aid in the diagnosis of influenza from Nasopharyngeal swab specimens and should not be used as a sole basis for treatment. Nasal washings and aspirates are unacceptable for Xpert Xpress SARS-CoV-2/FLU/RSV testing.  Fact Sheet for Patients: BloggerCourse.com  Fact Sheet for Healthcare Providers: SeriousBroker.it  This test is not yet approved or cleared by the [place] FDA and has been authorized for detection and/or diagnosis of SARS-CoV-2 by FDA under an Emergency Use Authorization (EUA). This EUA will remain in effect (meaning this test can be used)  for the duration of the COVID-19 declaration under Section 564(b)(1) of the Act, 21 U.S.C. section 360bbb-3(b)(1), unless the authorization is terminated or revoked.  Performed at [facility], [address] [place], [place] [address]   Gastrointestinal Panel by PCR , Stool     Status: None   Collection Time: 01/09/22  5:05 AM   Specimen: Stool  Result Value Ref Range Status   Campylobacter species NOT DETECTED NOT DETECTED Final   Plesimonas shigelloides NOT DETECTED NOT DETECTED Final   Salmonella species NOT DETECTED NOT DETECTED Final   Yersinia enterocolitica NOT DETECTED NOT DETECTED Final   Vibrio species NOT DETECTED NOT DETECTED Final   Vibrio cholerae NOT DETECTED NOT DETECTED Final   Enteroaggregative E coli (EAEC) NOT DETECTED NOT DETECTED Final   Enteropathogenic E coli (EPEC) NOT DETECTED NOT DETECTED Final   Enterotoxigenic E coli (ETEC) NOT DETECTED NOT DETECTED Final   Shiga like toxin producing E coli (STEC) NOT DETECTED NOT DETECTED Final   Shigella/Enteroinvasive E coli (EIEC) NOT DETECTED NOT DETECTED Final   Cryptosporidium NOT DETECTED NOT DETECTED Final   Cyclospora cayetanensis NOT DETECTED NOT DETECTED Final   Entamoeba histolytica NOT DETECTED NOT DETECTED Final   Giardia lamblia NOT DETECTED NOT DETECTED Final   Adenovirus F40/41 NOT DETECTED NOT DETECTED Final   Astrovirus NOT DETECTED NOT DETECTED Final   Norovirus GI/GII NOT DETECTED NOT DETECTED Final   Rotavirus A NOT DETECTED NOT DETECTED Final   Sapovirus (I, II, IV, and V) NOT DETECTED NOT DETECTED Final    Comment: Performed at [facility], [address] [place], [place] [address]      Studies: DG CHEST PORT 1 VIEW  Result Date: 01/09/2022 CLINICAL DATA:  Positive COVID-19 infection.  Weakness and diarrhea. EXAM: PORTABLE CHEST 1 VIEW COMPARISON:  04/08/2016. FINDINGS: Cardiac silhouette is top-normal in size. No mediastinal or hilar masses. Clear lungs.   No pleural effusion or pneumothorax. Skeletal structures are grossly intact. IMPRESSION: No active disease. Electronically Signed   By: Amie Portland M.D.   On: 01/09/2022 15:13    Scheduled Meds:  benztropine  0.5 mg Oral BID   cyanocobalamin  1,000 mcg Oral Daily   folic acid  1 mg Oral Daily   heparin  5,000 Units Subcutaneous Q8H   molnupiravir EUA  4 capsule Oral BID   potassium chloride  40 mEq Oral BID    Continuous Infusions:  lactated ringers 100 mL/hr at 01/09/22 1224     LOS: 2 days     Briant Cedar, MD [facility]  If 7PM-7AM, please contact night-coverage [email or web address] 01/10/2022, 2:53 PM

## 2022-01-10 NOTE — Progress Notes (Signed)
  Echocardiogram 2D Echocardiogram has been performed.  Jodi Evans 01/10/2022, 12:33 PM

## 2022-01-11 LAB — CK: Total CK: >50000 U/L — ABNORMAL HIGH (ref 38–234)

## 2022-01-11 LAB — URINE CULTURE: Culture: <10000 — AB

## 2022-01-11 LAB — COMPREHENSIVE METABOLIC PANEL
ALT: 142 U/L — ABNORMAL HIGH (ref 0–44)
AST: 290 U/L — ABNORMAL HIGH (ref 15–41)
Albumin: 2.7 g/dL — ABNORMAL LOW (ref 3.5–5.0)
Alkaline Phosphatase: 61 U/L (ref 38–126)
Anion gap: 7 (ref 5–15)
BUN: 26 mg/dL — ABNORMAL HIGH (ref 8–23)
CO2: 22 mmol/L (ref 22–32)
Calcium: 8.3 mg/dL — ABNORMAL LOW (ref 8.9–10.3)
Chloride: 113 mmol/L — ABNORMAL HIGH (ref 98–111)
Creatinine, Ser: 0.94 mg/dL (ref 0.44–1.00)
GFR, Estimated: >60 mL/min (ref >60)
Glucose, Bld: 95 mg/dL (ref 70–99)
Potassium: 4.1 mmol/L (ref 3.5–5.1)
Sodium: 142 mmol/L (ref 135–145)
Total Bilirubin: 0.7 mg/dL (ref 0.3–1.2)
Total Protein: 5.9 g/dL — ABNORMAL LOW (ref 6.5–8.1)

## 2022-01-11 LAB — CBC WITH DIFFERENTIAL/PLATELET
Abs Immature Granulocytes: 0.06 10*3/uL (ref 0.00–0.07)
Basophils Absolute: 0 10*3/uL (ref 0.0–0.1)
Basophils Relative: 0 %
Eosinophils Absolute: 0 10*3/uL (ref 0.0–0.5)
Eosinophils Relative: 1 %
HCT: 30.4 % — ABNORMAL LOW (ref 36.0–46.0)
Hemoglobin: 9.9 g/dL — ABNORMAL LOW (ref 12.0–15.0)
Immature Granulocytes: 1 %
Lymphocytes Relative: 18 %
Lymphs Abs: 1.2 10*3/uL (ref 0.7–4.0)
MCH: 26.4 pg (ref 26.0–34.0)
MCHC: 32.6 g/dL (ref 30.0–36.0)
MCV: 81.1 fL (ref 80.0–100.0)
Monocytes Absolute: 0.5 10*3/uL (ref 0.1–1.0)
Monocytes Relative: 8 %
Neutro Abs: 4.7 10*3/uL (ref 1.7–7.7)
Neutrophils Relative %: 72 %
Platelets: 140 10*3/uL — ABNORMAL LOW (ref 150–400)
RBC: 3.75 MIL/uL — ABNORMAL LOW (ref 3.87–5.11)
RDW: 14.6 % (ref 11.5–15.5)
WBC: 6.5 10*3/uL (ref 4.0–10.5)
nRBC: 0 % (ref 0.0–0.2)

## 2022-01-11 LAB — D-DIMER, QUANTITATIVE: D-Dimer, Quant: 2.41 ug/mL-FEU — ABNORMAL HIGH (ref 0.00–0.50)

## 2022-01-11 LAB — FERRITIN: Ferritin: 788 ng/mL — ABNORMAL HIGH (ref 11–307)

## 2022-01-11 LAB — TROPONIN I (HIGH SENSITIVITY): Troponin I (High Sensitivity): 67 ng/L — ABNORMAL HIGH (ref <18)

## 2022-01-11 LAB — C-REACTIVE PROTEIN: CRP: 2.7 mg/dL — ABNORMAL HIGH (ref <1.0)

## 2022-01-11 NOTE — Progress Notes (Signed)
PROGRESS NOTE  Jodi Evans [id number]:[id number] DOB: 04/09/1950 DOA: 01/08/2022 PCP: Fleet ContrasAvbuere, Edwin, MD  HPI/Recap of past 24 hours: Jodi JanSusie Ann Ilyas is a 71 y.o. female with medical history significant of HLD, HTN, bipolar d/o, schizophrenia presenting with weakness and diarrhea. She reports she was in her normal state of health until 2 days ago, started having constant, watery diarrhea, denied any fever, abdominal pain, chest pain, palpitations, or dyspnea. In the ED, found to be covid positive.  Patient admitted for further management.    Today, patient denies any new complaints.   Assessment/Plan: Principal Problem:   COVID-19 Active Problems:   Schizoaffective disorder, bipolar type (HCC)   SIRS (systemic inflammatory response syndrome) (HCC)   Diarrhea   Dehydration   Elevated LFTs   AKI (acute kidney injury) (HCC)   High anion gap metabolic acidosis   Elevated troponin   Hypokalemia   Hypocalcemia   HLD (hyperlipidemia)   History of hypertension   COVID 19 infection with gastroenteritis  Currently afebrile Reports resolving diarrhea Trend inflammatory markers Lactic acidosis resolved Chest x-ray unremarkable Stool panel unremarkable Continue lagevrio IV fluids Monitor closely  Hypokalemia Hypocalcemia Replace as needed  AKI Metabolic acidosis Likely 2/2 gastroenteritis Continue IV fluids Daily BMP  Elevated LFTs COVID-related rhabdomyolysis CK >50,000, will trend Hepatitis panel negative Right upper quadrant ultrasound showed multiple large gallstones of up to 2.8 cm, no wall thickening or pericholecystic fluid CT abdomen pelvis, unremarkable, except for above Continue to hold tegretol and statin IV fluids   Elevated troponin Likely 2/2 above Currently chest pain-free Flat trend, downtrending (238 -> 164) EKG is non-ischemic  Echo showed EF of 50 to 55%, no regional wall motion abnormality, grade 1 diastolic  dysfunction Telemetry  Hypertension Stable Hold BP meds   HLD Hold home regimen d/t elevated LFTs   Schizoaffective disorder Hold her tegretol d/t elevated LFTs       Estimated body mass index is 25.85 kg/m as calculated from the following:   Height as of this encounter: 5\' 8"  (1.727 m).   Weight as of this encounter: 77.1 kg.     Code Status: Full  Family Communication: None at bedside  Disposition Plan: Status is: Inpatient Remains inpatient appropriate because: Level of care      Consultants: None  Procedures: None  Antimicrobials: None   DVT prophylaxis: Heparin [place]   Objective: Vitals:   01/10/22 2145 01/11/22 0605 01/11/22 0907 01/11/22 1412  BP: (!) 129/56 (!) 143/108 (!) 154/69 (!) 118/93  Pulse: 75 81 85 77  Resp: 18 17 18 18   Temp: 98.5 F (36.9 C) 98.9 F (37.2 C) 98.3 F (36.8 C) 98 F (36.7 C)  TempSrc: Oral Oral    SpO2: (!) 10% 100% 100% 99%  Weight:      Height:        Intake/Output Summary (Last 24 hours) at 01/11/2022 1813 Last data filed at 01/11/2022 1752 Gross per 24 hour  Intake 3001.08 ml  Output 2250 ml  Net 751.08 ml   Filed Weights   01/08/22 1008  Weight: 77.1 kg    Exam: General: NAD  Cardiovascular: S1, S2 present Respiratory: CTAB Abdomen: Soft, nontender, nondistended, bowel sounds present Musculoskeletal: No bilateral pedal edema noted Skin: Normal Psychiatry: Normal mood    Data Reviewed: CBC: Recent Labs  Lab 01/08/22 1007 01/09/22 0440 01/10/22 0451 01/11/22 0337  WBC 12.3* 7.2 7.2 6.5  NEUTROABS 10.4* 5.6 5.5 4.7  HGB 14.1 9.2* 10.3* 9.9*  HCT 41.6  27.6* 31.1* 30.4*  MCV 77.9* 79.8* 80.4 81.1  PLT 171 106* 145* 140*   Basic Metabolic Panel: Recent Labs  Lab 01/08/22 1007 01/08/22 1200 01/08/22 1646 01/09/22 0440 01/10/22 0451 01/11/22 0337  NA 140 143 141 139 141 142  K 3.3* 3.7 3.3* 3.2* 3.3* 4.1  CL 106 120* 110 110 112* 113*  CO2 14* 13* 16* 16* 21* 22  GLUCOSE 129* 76  122* 99 105* 95  BUN 80* 62* 81* 72* 49* 26*  CREATININE 4.40* 2.97* 3.81* 2.57* 1.22* 0.94  CALCIUM 7.8* 5.2* 7.9* 7.7* 8.3* 8.3*  MG 2.7*  --   --  2.1  --   --    GFR: Estimated Creatinine Clearance: 60 mL/min (by C-G formula based on SCr of 0.94 mg/dL). Liver Function Tests: Recent Labs  Lab 01/08/22 1007 01/08/22 1646 01/09/22 0440 01/10/22 0451 01/11/22 0337  AST 893* 811* 519* 430* 290*  ALT 283* 266* 192* 180* 142*  ALKPHOS 95 91 66 70 61  BILITOT 0.7 0.9 0.6 0.7 0.7  PROT 8.6* 7.7 6.4* 6.7 5.9*  ALBUMIN 3.9 3.6 2.9* 3.1* 2.7*   Recent Labs  Lab 01/08/22 1007  LIPASE 66*   No results for input(s): "AMMONIA" in the last 168 hours. Coagulation Profile: No results for input(s): "INR", "PROTIME" in the last 168 hours. Cardiac Enzymes: Recent Labs  Lab 01/09/22 0440 01/11/22 0337  CKTOTAL >50,000* >50,000*   BNP (last 3 results) No results for input(s): "PROBNP" in the last 8760 hours. HbA1C: No results for input(s): "HGBA1C" in the last 72 hours. CBG: No results for input(s): "GLUCAP" in the last 168 hours. Lipid Profile: No results for input(s): "CHOL", "HDL", "LDLCALC", "TRIG", "CHOLHDL", "LDLDIRECT" in the last 72 hours. Thyroid Function Tests: No results for input(s): "TSH", "T4TOTAL", "FREET4", "T3FREE", "THYROIDAB" in the last 72 hours. Anemia Panel: Recent Labs    01/10/22 0451 01/11/22 0337  FERRITIN 1,079* 788*   Urine analysis:    Component Value Date/Time   COLORURINE YELLOW 01/08/2022 1856   APPEARANCEUR HAZY (A) 01/08/2022 1856   LABSPEC 1.013 01/08/2022 1856   PHURINE 5.0 01/08/2022 1856   GLUCOSEU NEGATIVE 01/08/2022 1856   HGBUR LARGE (A) 01/08/2022 1856   BILIRUBINUR NEGATIVE 01/08/2022 1856   KETONESUR NEGATIVE 01/08/2022 1856   PROTEINUR 30 (A) 01/08/2022 1856   NITRITE NEGATIVE 01/08/2022 1856   LEUKOCYTESUR MODERATE (A) 01/08/2022 1856   Sepsis Labs: @LABRCNTIP (procalcitonin:4,lacticidven:4)  ) Recent Results (from the  past 240 hour(s))  Resp Panel by RT-PCR (Flu A&B, Covid) Anterior Nasal Swab     Status: Abnormal   Collection Time: 01/08/22 10:07 AM   Specimen: Anterior Nasal Swab  Result Value Ref Range Status   SARS Coronavirus 2 by RT PCR POSITIVE (A) NEGATIVE Final    Comment: (NOTE) SARS-CoV-2 target nucleic acids are DETECTED.  The SARS-CoV-2 RNA is generally detectable in upper respiratory specimens during the acute phase of infection. Positive results are indicative of the presence of the identified virus, but do not rule out bacterial infection or co-infection with other pathogens not detected by the test. Clinical correlation with patient history and other diagnostic information is necessary to determine patient infection status. The expected result is Negative.  Fact Sheet for Patients: 13/10/23  Fact Sheet for Healthcare Providers: BloggerCourse.com  This test is not yet approved or cleared by the [place] FDA and  has been authorized for detection and/or diagnosis of SARS-CoV-2 by FDA under an Emergency Use Authorization (EUA).  This EUA will  remain in effect (meaning this test can be used) for the duration of  the COVID-19 declaration under Section 564(b)(1) of the A ct, 21 U.S.C. section 360bbb-3(b)(1), unless the authorization is terminated or revoked sooner.     Influenza A by PCR NEGATIVE NEGATIVE Final   Influenza B by PCR NEGATIVE NEGATIVE Final    Comment: (NOTE) The Xpert Xpress SARS-CoV-2/FLU/RSV plus assay is intended as an aid in the diagnosis of influenza from Nasopharyngeal swab specimens and should not be used as a sole basis for treatment. Nasal washings and aspirates are unacceptable for Xpert Xpress SARS-CoV-2/FLU/RSV testing.  Fact Sheet for Patients: BloggerCourse.com  Fact Sheet for Healthcare Providers: SeriousBroker.it  This test is not  yet approved or cleared by the [place] FDA and has been authorized for detection and/or diagnosis of SARS-CoV-2 by FDA under an Emergency Use Authorization (EUA). This EUA will remain in effect (meaning this test can be used) for the duration of the COVID-19 declaration under Section 564(b)(1) of the Act, 21 U.S.C. section 360bbb-3(b)(1), unless the authorization is terminated or revoked.  Performed at [facility], [address], [place], [place] [address]   Gastrointestinal Panel by PCR , Stool     Status: None   Collection Time: 01/09/22  5:05 AM   Specimen: Stool  Result Value Ref Range Status   Campylobacter species NOT DETECTED NOT DETECTED Final   Plesimonas shigelloides NOT DETECTED NOT DETECTED Final   Salmonella species NOT DETECTED NOT DETECTED Final   Yersinia enterocolitica NOT DETECTED NOT DETECTED Final   Vibrio species NOT DETECTED NOT DETECTED Final   Vibrio cholerae NOT DETECTED NOT DETECTED Final   Enteroaggregative E coli (EAEC) NOT DETECTED NOT DETECTED Final   Enteropathogenic E coli (EPEC) NOT DETECTED NOT DETECTED Final   Enterotoxigenic E coli (ETEC) NOT DETECTED NOT DETECTED Final   Shiga like toxin producing E coli (STEC) NOT DETECTED NOT DETECTED Final   Shigella/Enteroinvasive E coli (EIEC) NOT DETECTED NOT DETECTED Final   Cryptosporidium NOT DETECTED NOT DETECTED Final   Cyclospora cayetanensis NOT DETECTED NOT DETECTED Final   Entamoeba histolytica NOT DETECTED NOT DETECTED Final   Giardia lamblia NOT DETECTED NOT DETECTED Final   Adenovirus F40/41 NOT DETECTED NOT DETECTED Final   Astrovirus NOT DETECTED NOT DETECTED Final   Norovirus GI/GII NOT DETECTED NOT DETECTED Final   Rotavirus A NOT DETECTED NOT DETECTED Final   Sapovirus (I, II, IV, and V) NOT DETECTED NOT DETECTED Final    Comment: Performed at [facility], [address] [place], [place] [address]  Urine Culture     Status: Abnormal   Collection  Time: 01/10/22  5:28 PM   Specimen: Urine, Clean Catch  Result Value Ref Range Status   Specimen Description   Final    URINE, CLEAN CATCH Performed at [facility], [address], [place], [place] [address]    Special Requests   Final    NONE Performed at [facility], [address], [place], [place] [address]    Culture (A)  Final    <10,000 COLONIES/mL INSIGNIFICANT GROWTH Performed at [facility], [address] [place], [place] [address]    Report Status 01/11/2022 FINAL  Final      Studies: No results found.  Scheduled Meds:  benztropine  0.5 mg Oral BID   cyanocobalamin  1,000 mcg Oral Daily   folic acid  1 mg Oral Daily   heparin  5,000  Units Subcutaneous Q8H   molnupiravir EUA  4 capsule Oral BID    Continuous Infusions:  lactated ringers 100 mL/hr at 01/11/22 1226     LOS: 3 days     Briant Cedar, MD [facility]  If 7PM-7AM, please contact night-coverage [email or web address] 01/11/2022, 6:13 PM

## 2022-01-11 NOTE — Evaluation (Signed)
Physical Therapy Evaluation Patient Details Name: Jodi Evans MRN: [id number] DOB: 14-Jun-1950 Today's Date: 01/11/2022  History of Present Illness  71 y.o. female with medical history significant of HLD, HTN, bipolar d/o, schizophrenia presenting with weakness and diarrhea. Pt admitted 01/08/22 for Covid.  Clinical Impression  Pt admitted with above diagnosis.  Pt currently with functional limitations due to the deficits listed below (see PT Problem List). Pt will benefit from skilled PT to increase their independence and safety with mobility to allow discharge to the venue listed below.  Pt assisted with ambulation however only tolerated a short distance due to fatigue and weakness.  Pt agreeable to remain OOB in recliner.  RN into room end of session to restart IV.         Recommendations for follow up therapy are one component of a multi-disciplinary discharge planning process, led by the attending physician.  Recommendations may be updated based on patient status, additional functional criteria and insurance authorization.  Follow Up Recommendations Home health PT      Assistance Recommended at Discharge Intermittent Supervision/Assistance  Patient can return home with the following  Assistance with cooking/housework;Assist for transportation;Help with stairs or ramp for entrance    Equipment Recommendations Rolling walker (2 wheels)  Recommendations for Other Services       Functional Status Assessment Patient has had a recent decline in their functional status and demonstrates the ability to make significant improvements in function in a reasonable and predictable amount of time.     Precautions / Restrictions Precautions Precautions: Fall      Mobility  Bed Mobility Overal bed mobility: Needs Assistance Bed Mobility: Supine to Sit     Supine to sit: Supervision, HOB elevated          Transfers Overall transfer level: Needs assistance Equipment used:  None Transfers: Sit to/from Stand Sit to Stand: Min guard                Ambulation/Gait Ambulation/Gait assistance: Min guard, Min assist Gait Distance (Feet): 28 Feet Assistive device: Rolling walker (2 wheels) Gait Pattern/deviations: Step-through pattern, Decreased stride length Gait velocity: decr     General Gait Details: mild LOB requiring min assist,  distance to tolerance, pt reports weakness and fatigue, cues for RW positioning  Stairs            Wheelchair Mobility    Modified Rankin (Stroke Patients Only)       Balance Overall balance assessment: Mild deficits observed, not formally tested                                           Pertinent Vitals/Pain Pain Assessment Pain Assessment: No/denies pain    Home Living Family/patient expects to be discharged to:: Private residence Living Arrangements: Alone   Type of Home: Apartment Home Access: Level entry       Home Layout: One level Home Equipment: None      Prior Function Prior Level of Function : Independent/Modified Independent                     Hand Dominance        Extremity/Trunk Assessment        Lower Extremity Assessment Lower Extremity Assessment: Generalized weakness    Cervical / Trunk Assessment Cervical / Trunk Assessment: Normal  Communication   Communication: No difficulties  Cognition Arousal/Alertness: Awake/alert Behavior During Therapy: Flat affect Overall Cognitive Status: Within Functional Limits for tasks assessed                                          General Comments      Exercises     Assessment/Plan    PT Assessment Patient needs continued PT services  PT Problem List Decreased balance;Decreased activity tolerance;Decreased strength;Decreased mobility;Decreased knowledge of use of DME       PT Treatment Interventions Gait training;DME instruction;Therapeutic exercise;Balance  training;Functional mobility training;Therapeutic activities;Patient/family education    PT Goals (Current goals can be found in the Care Plan section)  Acute Rehab PT Goals PT Goal Formulation: With patient Time For Goal Achievement: 01/25/22 Potential to Achieve Goals: Good    Frequency Min 3X/week     Co-evaluation               AM-PAC PT "6 Clicks" Mobility  Outcome Measure Help needed turning from your back to your side while in a flat bed without using bedrails?: A Little Help needed moving from lying on your back to sitting on the side of a flat bed without using bedrails?: A Little Help needed moving to and from a bed to a chair (including a wheelchair)?: A Little Help needed standing up from a chair using your arms (e.g., wheelchair or bedside chair)?: A Little Help needed to walk in hospital room?: A Little Help needed climbing 3-5 steps with a railing? : A Lot 6 Click Score: 17    End of Session Equipment Utilized During Treatment: Gait belt Activity Tolerance: Patient tolerated treatment well Patient left: in chair;with call bell/phone within reach;with chair alarm set;with nursing/sitter in room Nurse Communication: Mobility status PT Visit Diagnosis: Difficulty in walking, not elsewhere classified (R26.2);Muscle weakness (generalized) (M62.81)    Time: 0539-7673 PT Time Calculation (min) (ACUTE ONLY): 11 min   Charges:   PT Evaluation $PT Eval Low Complexity: 1 Low        Kati PT, DPT Physical Therapist Acute Rehabilitation Services Preferred contact method: Secure Chat Weekend Pager Only: [phone] Office: [phone]   Kati L Payson 01/11/2022, 1:20 PM

## 2022-01-11 NOTE — Evaluation (Signed)
Occupational Therapy Evaluation Patient Details Name: Jodi Evans MRN: [id number] DOB: [birth year] Today's Date: 01/11/2022   History of Present Illness 71 y.o. female with medical history significant of HLD, HTN, bipolar d/o, schizophrenia presenting with weakness and diarrhea. Pt admitted 01/08/22 for Covid.   Clinical Impression   Patient evaluated by Occupational Therapy with no further acute OT needs identified. All education has been completed and the patient has no further questions. Patient endorses being at baseline for ADLs at this time.  See below for any follow-up Occupational Therapy or equipment needs. OT is signing off. Thank you for this referral.       Recommendations for follow up therapy are one component of a multi-disciplinary discharge planning process, led by the attending physician.  Recommendations may be updated based on patient status, additional functional criteria and insurance authorization.   Follow Up Recommendations  No OT follow up     Assistance Recommended at Discharge PRN  Patient can return home with the following      Functional Status Assessment  Patient has not had a recent decline in their functional status  Equipment Recommendations  None recommended by OT    Recommendations for Other Services       Precautions / Restrictions Precautions Precautions: Fall Restrictions Weight Bearing Restrictions: No      Mobility Bed Mobility Overal bed mobility: Needs Assistance Bed Mobility: Supine to Sit     Supine to sit: Supervision          Transfers                          Balance Overall balance assessment: No apparent balance deficits (not formally assessed)                                         ADL either performed or assessed with clinical judgement   ADL Overall ADL's : Modified independent                                       General ADL Comments: patient was  able to complete LB Dressing tasks, bed mobility, transfers in room with RW, functional mobility in room, and reaching tasks with no LOB. patient endorsed being at baseline for ADLs at this time. patient was educated on not using pure wic and asking staff to take her to the bathroom. patient verbalized understanding.nurse was educated on the same     Vision         Perception     Praxis      Pertinent Vitals/Pain Pain Assessment Pain Assessment: No/denies pain     Hand Dominance     Extremity/Trunk Assessment Upper Extremity Assessment Upper Extremity Assessment: Overall WFL for tasks assessed   Lower Extremity Assessment Lower Extremity Assessment: Defer to PT evaluation   Cervical / Trunk Assessment Cervical / Trunk Assessment: Normal   Communication Communication Communication: No difficulties   Cognition Arousal/Alertness: Awake/alert Behavior During Therapy: Flat affect Overall Cognitive Status: Within Functional Limits for tasks assessed                                       General Comments  Exercises     Shoulder Instructions      Home Living Family/patient expects to be discharged to:: Private residence Living Arrangements: Alone   Type of Home: Apartment Home Access: Level entry     Home Layout: One level               Home Equipment: None          Prior Functioning/Environment Prior Level of Function : Independent/Modified Independent                        OT Problem List:        OT Treatment/Interventions:      OT Goals(Current goals can be found in the care plan section) Acute Rehab OT Goals Patient Stated Goal: to go home OT Goal Formulation: All assessment and education complete, DC therapy  OT Frequency:      Co-evaluation              AM-PAC OT "6 Clicks" Daily Activity     Outcome Measure Help from another person eating meals?: None Help from another person taking care of  personal grooming?: None Help from another person toileting, which includes using toliet, bedpan, or urinal?: None Help from another person bathing (including washing, rinsing, drying)?: None Help from another person to put on and taking off regular upper body clothing?: None Help from another person to put on and taking off regular lower body clothing?: None 6 Click Score: 24   End of Session Equipment Utilized During Treatment: Rolling walker (2 wheels) Nurse Communication: Other (comment) (need to get away from pure wic use)  Activity Tolerance: Patient tolerated treatment well Patient left: in bed;with call bell/phone within reach;with bed alarm set  OT Visit Diagnosis: Unsteadiness on feet (R26.81);Other abnormalities of gait and mobility (R26.89)                Time: 1062-6948 OT Time Calculation (min): 12 min Charges:  OT General Charges $OT Visit: 1 Visit OT Evaluation $OT Eval Low Complexity: 1 Low  Siddarth Hsiung OTR/L, MS Acute Rehabilitation Department Office# [phone]   Ardyth Harps 01/11/2022, 3:15 PM

## 2022-01-11 NOTE — Plan of Care (Signed)

## 2022-01-12 LAB — D-DIMER, QUANTITATIVE: D-Dimer, Quant: 4.37 ug/mL-FEU — ABNORMAL HIGH (ref 0.00–0.50)

## 2022-01-12 LAB — COMPREHENSIVE METABOLIC PANEL
ALT: 139 U/L — ABNORMAL HIGH (ref 0–44)
AST: 202 U/L — ABNORMAL HIGH (ref 15–41)
Albumin: 3.1 g/dL — ABNORMAL LOW (ref 3.5–5.0)
Alkaline Phosphatase: 67 U/L (ref 38–126)
Anion gap: 13 (ref 5–15)
BUN: 17 mg/dL (ref 8–23)
CO2: 18 mmol/L — ABNORMAL LOW (ref 22–32)
Calcium: 8.6 mg/dL — ABNORMAL LOW (ref 8.9–10.3)
Chloride: 112 mmol/L — ABNORMAL HIGH (ref 98–111)
Creatinine, Ser: 0.95 mg/dL (ref 0.44–1.00)
GFR, Estimated: >60 mL/min (ref >60)
Glucose, Bld: 103 mg/dL — ABNORMAL HIGH (ref 70–99)
Potassium: 3.7 mmol/L (ref 3.5–5.1)
Sodium: 143 mmol/L (ref 135–145)
Total Bilirubin: 0.7 mg/dL (ref 0.3–1.2)
Total Protein: 6.3 g/dL — ABNORMAL LOW (ref 6.5–8.1)

## 2022-01-12 LAB — CBC WITH DIFFERENTIAL/PLATELET
Abs Immature Granulocytes: 0.07 10*3/uL (ref 0.00–0.07)
Basophils Absolute: 0 10*3/uL (ref 0.0–0.1)
Basophils Relative: 0 %
Eosinophils Absolute: 0.1 10*3/uL (ref 0.0–0.5)
Eosinophils Relative: 2 %
HCT: 31.8 % — ABNORMAL LOW (ref 36.0–46.0)
Hemoglobin: 10.6 g/dL — ABNORMAL LOW (ref 12.0–15.0)
Immature Granulocytes: 1 %
Lymphocytes Relative: 21 %
Lymphs Abs: 1.3 10*3/uL (ref 0.7–4.0)
MCH: 26.5 pg (ref 26.0–34.0)
MCHC: 33.3 g/dL (ref 30.0–36.0)
MCV: 79.5 fL — ABNORMAL LOW (ref 80.0–100.0)
Monocytes Absolute: 0.6 10*3/uL (ref 0.1–1.0)
Monocytes Relative: 10 %
Neutro Abs: 4.3 10*3/uL (ref 1.7–7.7)
Neutrophils Relative %: 66 %
Platelets: 149 10*3/uL — ABNORMAL LOW (ref 150–400)
RBC: 4 MIL/uL (ref 3.87–5.11)
RDW: 14.6 % (ref 11.5–15.5)
WBC: 6.5 10*3/uL (ref 4.0–10.5)
nRBC: 0 % (ref 0.0–0.2)

## 2022-01-12 LAB — C-REACTIVE PROTEIN: CRP: 2 mg/dL — ABNORMAL HIGH (ref <1.0)

## 2022-01-12 LAB — FERRITIN: Ferritin: 733 ng/mL — ABNORMAL HIGH (ref 11–307)

## 2022-01-12 LAB — CK: Total CK: 2949 U/L — ABNORMAL HIGH (ref 38–234)

## 2022-01-12 MED ORDER — AMLODIPINE BESYLATE 10 MG PO TABS
10.0000 mg | ORAL_TABLET | Freq: Every day | ORAL | Status: DC
  Administered 2022-01-12: 10 mg via ORAL
  Filled 2022-01-12: qty 1

## 2022-01-12 MED ORDER — MOLNUPIRAVIR EUA 200MG CAPSULE: 4.0000 | ORAL_CAPSULE | Freq: Two times a day (BID) | ORAL | 0 refills | Status: AC

## 2022-01-12 NOTE — TOC Transition Note (Signed)
Transition of Care [facility]) - CM/SW Discharge Note   Patient Details  Name: Jodi Evans MRN: [id number] Date of Birth: [birth year]  Transition of Care [facility]) CM/SW Contact:  Lennart Pall, LCSW Phone Number: 01/12/2022, 2:35 PM   Clinical Narrative:    Met with pt to review dc needs.  Pt aware MD planning for dc today and has placed orders for HHPT/OT - no agency preference - referral accepted with [facility].  Pt confirms that she does NOT have a rolling walker at home and no DME agency preference - ordered via [place] for delivery to room today.  Have alerted son, Herbie [place], of expected dc today and he hopes to be here by 5pm.  No further TOC needs.   Final next level of care: [place] Barriers to Discharge: No Barriers Identified   Patient Goals and CMS Choice Patient states their goals for this hospitalization and ongoing recovery are:: return home      Discharge Placement                       Discharge Plan and Services                DME Arranged: Walker rolling DME Agency: [employer] Date DME Agency Contacted: 01/12/22 Time DME Agency Contacted: 2081 Representative spoke with at DME Agency: Erasmo Downer HH Arranged: PT, OT [place] Agency: [place] Date [place]: 01/12/22 Time [place]: [place] Representative spoke with at [place]: [place] ([place]) Interventions     Readmission Risk Interventions    01/12/2022    2:27 PM  Readmission Risk Prevention Plan  Post Dischage Appt Complete  Medication Screening Complete  Transportation Screening Complete

## 2022-01-12 NOTE — Progress Notes (Signed)
Mobility Specialist - Progress Note   01/12/22 1148  Mobility  Activity Ambulated with assistance in hallway  Level of Assistance Standby assist, set-up cues, supervision of patient - no hands on  Assistive Device Front wheel walker  Distance Ambulated (ft) 160 ft  Activity Response Tolerated well  Mobility Referral Yes  $Mobility charge 1 Mobility   Pt received in recliner and agreeable to mobility. No complaints during mobility. Pt to bed after session with all needs met.      [facility]

## 2022-01-12 NOTE — Plan of Care (Signed)
  Problem: Education: Goal: Knowledge of General Education information will improve Description Including pain rating scale, medication(s)/side effects and non-pharmacologic comfort measures Outcome: Progressing   Problem: Health Behavior/Discharge Planning: Goal: Ability to manage health-related needs will improve Outcome: Progressing   

## 2022-01-12 NOTE — Plan of Care (Signed)
  Problem: Education: Goal: Knowledge of risk factors and measures for prevention of condition will improve Outcome: Progressing   

## 2022-01-12 NOTE — Progress Notes (Signed)
The patient is alert and oriented and has been seen by her physician. The orders for discharge were written. IV has been removed. Went over discharge instructions with patient and family. She is being discharged via wheelchair with all of her belongings.  

## 2022-01-12 NOTE — Discharge Summary (Signed)
Physician Discharge Summary   Patient: Jodi Evans MRN: [id number] DOB: May 10, 1950  Admit date:     01/08/2022  Discharge date: 01/12/22  Discharge Physician: Briant Cedar   PCP: Jodi Contras, MD   Recommendations at discharge:   Follow-up with PCP in 1 week  Discharge Diagnoses: Principal Problem:   COVID-19 Active Problems:   Schizoaffective disorder, bipolar type (HCC)   SIRS (systemic inflammatory response syndrome) (HCC)   Diarrhea   Dehydration   Elevated LFTs   AKI (acute kidney injury) (HCC)   High anion gap metabolic acidosis   Elevated troponin   Hypokalemia   Hypocalcemia   HLD (hyperlipidemia)   History of hypertension    Hospital Course: Jodi Evans is a 71 y.o. female with medical history significant of HLD, HTN, bipolar d/o, schizophrenia presenting with weakness and diarrhea. She reports she was in her normal state of health until 2 days ago, started having constant, watery diarrhea, denied any fever, abdominal pain, chest pain, palpitations, or dyspnea. In the ED, found to be covid positive.  Patient admitted for further management.    Patient denies any new complaints.  Advised patient to complete her medications for COVID infection, stay hydrated and quarantine for the next couple of days.   Assessment and Plan:  COVID 19 infection with gastroenteritis  Currently afebrile Reports resolving diarrhea Trend inflammatory markers Lactic acidosis resolved Chest x-ray unremarkable Stool panel unremarkable Continue lagevrio to complete dose PCP follow up   Hypokalemia Hypocalcemia Replaced as needed   AKI Metabolic acidosis Likely 2/2 gastroenteritis S/p IV fluids   Elevated LFTs COVID-related rhabdomyolysis CK >50,000--->2,949 Hepatitis panel negative Right upper quadrant ultrasound showed multiple large gallstones of up to 2.8 cm, no wall thickening or pericholecystic fluid CT abdomen pelvis, unremarkable, except  for above Restart tegretol and continue to hold statin S/p IV fluids   Elevated troponin Likely 2/2 above Currently chest pain-free Flat trend, downtrending (238 -> 164-->67) EKG is non-ischemic  Echo showed EF of 50 to 55%, no regional wall motion abnormality, grade 1 diastolic dysfunction   Hypertension Stable Hold HCTZ, consider d/c if BP stable (pt does report hx of frequent falls)   HLD Hold home regimen d/t elevated LFTs   Schizoaffective disorder Continue tegretol     Consultants: None Procedures performed: None Disposition: Home Diet recommendation:  Regular diet   DISCHARGE MEDICATION: Allergies as of 01/12/2022       Reactions   Penicillins    Has patient had a PCN reaction causing immediate rash, facial/tongue/throat swelling, SOB or lightheadedness with hypotension:denied allergy Has patient had a PCN reaction causing severe rash involving mucus membranes or skin necrosis: denied allergy Has patient had a PCN reaction that required hospitalization denied allergy Has patient had a PCN reaction occurring within the last 10 years:denied allergy If all of the above answers are "NO", then may proceed with Cephalosporin use. Patient denies a   Shellfish Allergy    Seafood-turns legs purple        Medication List     STOP taking these medications    atorvastatin 10 MG tablet Commonly known as: LIPITOR   hydrochlorothiazide 12.5 MG tablet Commonly known as: HYDRODIURIL       TAKE these medications    amLODipine 10 MG tablet Commonly known as: NORVASC Take 10 mg by mouth daily.   baclofen 10 MG tablet Commonly known as: LIORESAL Take 10 mg by mouth 2 (two) times daily as needed for muscle spasms.  benztropine 0.5 MG tablet Commonly known as: COGENTIN Take 1 tablet (0.5 mg total) by mouth daily. What changed: when to take this   carbamazepine 200 MG tablet Commonly known as: TEGRETOL Take 1 tablet (200 mg total) by mouth 2 (two) times  daily at 8 am and 10 pm. What changed: when to take this   cyanocobalamin 1000 MCG tablet Commonly known as: VITAMIN B12 Take 1,000 mcg by mouth daily.   fluPHENAZine 5 MG tablet Commonly known as: PROLIXIN Take 1 tablet (5 mg total) by mouth 2 (two) times daily.   fluPHENAZine decanoate 25 MG/ML injection Commonly known as: PROLIXIN Inject 2 mLs (50 mg total) into the muscle every 14 (fourteen) days.   folic acid 1 MG tablet Commonly known as: FOLVITE Take 1 mg by mouth daily.   meloxicam 15 MG tablet Commonly known as: MOBIC Take 15 mg by mouth daily.   molnupiravir EUA 200 mg Caps capsule Commonly known as: LAGEVRIO Take 4 capsules (800 mg total) by mouth 2 (two) times daily for 5 days.   Vitamin D (Ergocalciferol) 1.25 MG (50000 UNIT) Caps capsule Commonly known as: DRISDOL Take 50,000 Units by mouth once a week.               Durable Medical Equipment  (From admission, onward)           Start     Ordered   01/12/22 1434  For home use only DME Walker rolling  Once       Question Answer Comment  Walker: With 5 Inch Wheels   Patient needs a walker to treat with the following condition Home help needed      01/12/22 1433            Follow-up Information     Jodi Contras, MD. Schedule an appointment as soon as possible for a visit in 1 week(s).   Specialty: Internal Medicine Contact information: [address] [place] [address] [phone]                Discharge Exam: Jodi Evans Weights   01/08/22 1008  Weight: 77.1 kg   General: NAD  Cardiovascular: S1, S2 present Respiratory: CTAB Abdomen: Soft, nontender, nondistended, bowel sounds present Musculoskeletal: No bilateral pedal edema noted Skin: Normal Psychiatry: Normal mood   Condition at discharge: stable  The results of significant diagnostics from this hospitalization (including imaging, microbiology, ancillary and laboratory) are listed below for reference.    Imaging Studies: ECHOCARDIOGRAM COMPLETE  Result Date: 01/10/2022    ECHOCARDIOGRAM REPORT   Patient Name:   Jodi Evans Date of Exam: 01/10/2022 Medical Rec #:  [id number]          Height:       68.0 in Accession #:    [id number]         Weight:       170.0 lb Date of Birth:  February 01, 1951         BSA:          1.907 m Patient Age:    70 years           BP:           159/95 mmHg Patient Gender: F                  HR:           70 bpm. Exam Location:  Inpatient Procedure: 2D Echo, Color Doppler and Cardiac Doppler Indications:  Elevated troponin  History:        Patient has no prior history of Echocardiogram examinations.                 Risk Factors:Hypertension, Dyslipidemia and Former Smoker.  Sonographer:    Milda Smart Referring Phys: [id number] [facility] J Zachrey Deutscher  Sonographer Comments: Image acquisition challenging due to patient body habitus and Image acquisition challenging due to respiratory motion. IMPRESSIONS  1. Left ventricular ejection fraction, by estimation, is 50 to 55%. The left ventricle has low normal function. The left ventricle has no regional wall motion abnormalities. There is mild asymmetric left ventricular hypertrophy of the basal-septal segment. Left ventricular diastolic parameters are consistent with Grade I diastolic dysfunction (impaired relaxation).  2. Right ventricular systolic function is normal. The right ventricular size is normal. Tricuspid regurgitation signal is inadequate for assessing PA pressure.  3. The mitral valve is abnormal. Trivial mitral valve regurgitation.  4. The aortic valve is tricuspid. Aortic valve regurgitation is not visualized.  5. Aortic dilatation noted. There is borderline dilatation of the ascending aorta, measuring 39 mm.  6. The inferior vena cava is normal in size with greater than 50% respiratory variability, suggesting right atrial pressure of 3 mmHg. Comparison(s): No prior Echocardiogram. FINDINGS  Left Ventricle: Left  ventricular ejection fraction, by estimation, is 50 to 55%. The left ventricle has low normal function. The left ventricle has no regional wall motion abnormalities. The left ventricular internal cavity size was normal in size. There is mild asymmetric left ventricular hypertrophy of the basal-septal segment. Left ventricular diastolic parameters are consistent with Grade I diastolic dysfunction (impaired relaxation). Indeterminate filling pressures. Right Ventricle: The right ventricular size is normal. No increase in right ventricular wall thickness. Right ventricular systolic function is normal. Tricuspid regurgitation signal is inadequate for assessing PA pressure. Left Atrium: Left atrial size was normal in size. Right Atrium: Right atrial size was normal in size. Pericardium: There is no evidence of pericardial effusion. Mitral Valve: The mitral valve is abnormal. There is mild calcification of the anterior and posterior mitral valve leaflet(s). Trivial mitral valve regurgitation. MV peak gradient, 6.8 mmHg. The mean mitral valve gradient is 2.0 mmHg. Tricuspid Valve: The tricuspid valve is normal in structure. Tricuspid valve regurgitation is not demonstrated. Aortic Valve: The aortic valve is tricuspid. Aortic valve regurgitation is not visualized. Pulmonic Valve: The pulmonic valve was normal in structure. Pulmonic valve regurgitation is not visualized. Aorta: Aortic dilatation noted. There is borderline dilatation of the ascending aorta, measuring 39 mm. Venous: The inferior vena cava is normal in size with greater than 50% respiratory variability, suggesting right atrial pressure of 3 mmHg. IAS/Shunts: No atrial level shunt detected by color flow Doppler.  LEFT VENTRICLE PLAX 2D LVIDd:         4.10 cm     Diastology LVIDs:         2.15 cm     LV e' medial:    4.35 cm/s LV PW:         1.00 cm     LV E/e' medial:  11.7 LV IVS:        1.10 cm     LV e' lateral:   5.77 cm/s LVOT diam:     2.40 cm     LV E/e'  lateral: 8.8 LV SV:         77 LV SV Index:   41 LVOT Area:     4.52 cm  LV Volumes (MOD) LV  vol d, MOD A2C: 95.1 ml LV vol d, MOD A4C: 94.6 ml LV vol s, MOD A2C: 54.5 ml LV vol s, MOD A4C: 46.8 ml LV SV MOD A2C:     40.6 ml LV SV MOD A4C:     94.6 ml LV SV MOD BP:      44.4 ml RIGHT VENTRICLE             IVC RV S prime:     12.40 cm/s  IVC diam: 1.90 cm TAPSE (M-mode): 1.8 cm LEFT ATRIUM             Index        RIGHT ATRIUM          Index LA diam:        3.20 cm 1.68 cm/m   RA Area:     9.95 cm LA Vol (A2C):   41.2 ml 21.60 ml/m  RA Volume:   20.20 ml 10.59 ml/m LA Vol (A4C):   21.4 ml 11.22 ml/m LA Biplane Vol: 30.6 ml 16.04 ml/m  AORTIC VALVE LVOT Vmax:   102.00 cm/s LVOT Vmean:  72.800 cm/s LVOT VTI:    0.171 m  AORTA Ao Root diam: 3.60 cm Ao Asc diam:  3.90 cm MITRAL VALVE MV Area (PHT): 2.04 cm    SHUNTS MV Area VTI:   2.95 cm    Systemic VTI:  0.17 m MV Peak grad:  6.8 mmHg    Systemic Diam: 2.40 cm MV Mean grad:  2.0 mmHg MV Vmax:       1.30 m/s MV Vmean:      59.8 cm/s MV Decel Time: 372 msec MV E velocity: 51.00 cm/s MV A velocity: 98.50 cm/s MV E/A ratio:  0.52 Zoila Shutter MD Electronically signed by Zoila Shutter MD Signature Date/Time: 01/10/2022/3:08:13 PM    Final    DG CHEST PORT 1 VIEW  Result Date: 01/09/2022 CLINICAL DATA:  Positive COVID-19 infection.  Weakness and diarrhea. EXAM: PORTABLE CHEST 1 VIEW COMPARISON:  04/08/2016. FINDINGS: Cardiac silhouette is top-normal in size. No mediastinal or hilar masses. Clear lungs.  No pleural effusion or pneumothorax. Skeletal structures are grossly intact. IMPRESSION: No active disease. Electronically Signed   By: Amie Portland M.D.   On: 01/09/2022 15:13   US Abdomen Limited RUQ (LIVER/GB)  Result Date: 01/08/2022 CLINICAL DATA:  Abdominal pain EXAM: ULTRASOUND ABDOMEN LIMITED RIGHT UPPER QUADRANT COMPARISON:  CT of earlier today FINDINGS: Gallbladder: Multiple large gallstones of up to 2.8 cm. No wall thickening or pericholecystic  fluid. Sonographic Murphy's sign was not elicited. Gallbladder distention. Common bile duct: Diameter: Normal, 3 mm Liver: No focal lesion identified. Within normal limits in parenchymal echogenicity. Portal vein is patent on color Doppler imaging with normal direction of blood flow towards the liver. Other: None. IMPRESSION: Cholelithiasis and gallbladder distension without acute cholecystitis or biliary duct dilatation. Electronically Signed   By: Jeronimo Greaves M.D.   On: 01/08/2022 13:31   CT Head Wo Contrast  Result Date: 01/08/2022 CLINICAL DATA:  Mental status changes EXAM: CT HEAD WITHOUT CONTRAST TECHNIQUE: Contiguous axial images were obtained from the base of the skull through the vertex without intravenous contrast. RADIATION DOSE REDUCTION: This exam was performed according to the departmental dose-optimization program which includes automated exposure control, adjustment of the mA and/or kV according to patient size and/or use of iterative reconstruction technique. COMPARISON:  None Available. FINDINGS: Brain: Moderate low density in the periventricular white matter likely related to small vessel disease.  Age advanced cerebellar atrophy. No mass lesion, hemorrhage, hydrocephalus, acute infarct, intra-axial, or extra-axial fluid collection. Vascular: No hyperdense vessel or unexpected calcification. Skull: No significant soft tissue swelling.  No skull fracture. Sinuses/Orbits: Normal imaged portions of the orbits and globes. Hypoplastic frontal sinuses. Clear mastoid air cells. Other: None. IMPRESSION: 1.  No acute intracranial abnormality. 2. Cerebellar atrophy 3. Moderate small vessel ischemic change. Electronically Signed   By: Jeronimo GreavesKyle  Talbot M.D.   On: 01/08/2022 12:23   CT ABDOMEN PELVIS WO CONTRAST  Result Date: 01/08/2022 CLINICAL DATA:  Nausea/vomiting. Evaluate for diverticulitis or colitis. EXAM: CT ABDOMEN AND PELVIS WITHOUT CONTRAST TECHNIQUE: Multidetector CT imaging of the abdomen  and pelvis was performed following the standard protocol without IV contrast. RADIATION DOSE REDUCTION: This exam was performed according to the departmental dose-optimization program which includes automated exposure control, adjustment of the mA and/or kV according to patient size and/or use of iterative reconstruction technique. COMPARISON:  None Available. FINDINGS: Lower chest: No acute abnormality. Hepatobiliary: No focal liver abnormality. The gallbladder appears distended and is filled with multiple stones. No gallbladder wall inflammation or pericholecystic fluid. No signs of bile duct dilatation. Pancreas: Unremarkable. No pancreatic ductal dilatation or surrounding inflammatory changes. Spleen: Normal in size without focal abnormality. Adrenals/Urinary Tract: Normal adrenal glands. No nephrolithiasis or hydronephrosis or mass. Urinary bladder appears normal. Stomach/Bowel: Stomach appears normal. The appendix is visualized and appears normal. No small bowel wall thickening, inflammation or distension. Diffuse colonic diverticulosis identified. No significant pericolonic fat stranding. No significant bowel wall thickening identified. No signs of acute diverticulitis. Vascular/Lymphatic: Aortic atherosclerotic calcifications. No aneurysm. No signs of abdominopelvic adenopathy. Reproductive: Uterus and bilateral adnexa are unremarkable. Other: No free fluid or fluid collections. No signs of pneumoperitoneum. Musculoskeletal: Degenerative disc disease identified at L3-4 through L5-S1. No acute or suspicious osseous findings. IMPRESSION: 1. No acute findings within the abdomen or pelvis. 2. Diffuse colonic diverticulosis without signs of acute diverticulitis. 3. Distended gallbladder with multiple stones. No gallbladder wall inflammation or pericholecystic fluid. 4.  Aortic Atherosclerosis (ICD10-I70.0). Electronically Signed   By: Signa Kellaylor  Stroud M.D.   On: 01/08/2022 12:18    Microbiology: Results for  orders placed or performed during the hospital encounter of 01/08/22  Resp Panel by RT-PCR (Flu A&B, Covid) Anterior Nasal Swab     Status: Abnormal   Collection Time: 01/08/22 10:07 AM   Specimen: Anterior Nasal Swab  Result Value Ref Range Status   SARS Coronavirus 2 by RT PCR POSITIVE (A) NEGATIVE Final    Comment: (NOTE) SARS-CoV-2 target nucleic acids are DETECTED.  The SARS-CoV-2 RNA is generally detectable in upper respiratory specimens during the acute phase of infection. Positive results are indicative of the presence of the identified virus, but do not rule out bacterial infection or co-infection with other pathogens not detected by the test. Clinical correlation with patient history and other diagnostic information is necessary to determine patient infection status. The expected result is Negative.  Fact Sheet for Patients: BloggerCourse.comhttps://www.fda.gov/media/152166/download  Fact Sheet for Healthcare Providers: SeriousBroker.ithttps://www.fda.gov/media/152162/download  This test is not yet approved or cleared by the [place] FDA and  has been authorized for detection and/or diagnosis of SARS-CoV-2 by FDA under an Emergency Use Authorization (EUA).  This EUA will remain in effect (meaning this test can be used) for the duration of  the COVID-19 declaration under Section 564(b)(1) of the A ct, 21 U.S.C. section 360bbb-3(b)(1), unless the authorization is terminated or revoked sooner.     Influenza A by  PCR NEGATIVE NEGATIVE Final   Influenza B by PCR NEGATIVE NEGATIVE Final    Comment: (NOTE) The Xpert Xpress SARS-CoV-2/FLU/RSV plus assay is intended as an aid in the diagnosis of influenza from Nasopharyngeal swab specimens and should not be used as a sole basis for treatment. Nasal washings and aspirates are unacceptable for Xpert Xpress SARS-CoV-2/FLU/RSV testing.  Fact Sheet for Patients: BloggerCourse.com  Fact Sheet for Healthcare  Providers: SeriousBroker.it  This test is not yet approved or cleared by the [place] FDA and has been authorized for detection and/or diagnosis of SARS-CoV-2 by FDA under an Emergency Use Authorization (EUA). This EUA will remain in effect (meaning this test can be used) for the duration of the COVID-19 declaration under Section 564(b)(1) of the Act, 21 U.S.C. section 360bbb-3(b)(1), unless the authorization is terminated or revoked.  Performed at [facility], [address] [place], [place] [address]   Gastrointestinal Panel by PCR , Stool     Status: None   Collection Time: 01/09/22  5:05 AM   Specimen: Stool  Result Value Ref Range Status   Campylobacter species NOT DETECTED NOT DETECTED Final   Plesimonas shigelloides NOT DETECTED NOT DETECTED Final   Salmonella species NOT DETECTED NOT DETECTED Final   Yersinia enterocolitica NOT DETECTED NOT DETECTED Final   Vibrio species NOT DETECTED NOT DETECTED Final   Vibrio cholerae NOT DETECTED NOT DETECTED Final   Enteroaggregative E coli (EAEC) NOT DETECTED NOT DETECTED Final   Enteropathogenic E coli (EPEC) NOT DETECTED NOT DETECTED Final   Enterotoxigenic E coli (ETEC) NOT DETECTED NOT DETECTED Final   Shiga like toxin producing E coli (STEC) NOT DETECTED NOT DETECTED Final   Shigella/Enteroinvasive E coli (EIEC) NOT DETECTED NOT DETECTED Final   Cryptosporidium NOT DETECTED NOT DETECTED Final   Cyclospora cayetanensis NOT DETECTED NOT DETECTED Final   Entamoeba histolytica NOT DETECTED NOT DETECTED Final   Giardia lamblia NOT DETECTED NOT DETECTED Final   Adenovirus F40/41 NOT DETECTED NOT DETECTED Final   Astrovirus NOT DETECTED NOT DETECTED Final   Norovirus GI/GII NOT DETECTED NOT DETECTED Final   Rotavirus A NOT DETECTED NOT DETECTED Final   Sapovirus (I, II, IV, and V) NOT DETECTED NOT DETECTED Final    Comment: Performed at [facility], [address],  [place], [place] [address]  Urine Culture     Status: Abnormal   Collection Time: 01/10/22  5:28 PM   Specimen: Urine, Clean Catch  Result Value Ref Range Status   Specimen Description   Final    URINE, CLEAN CATCH Performed at [facility], [address] [place], [place] [address]    Special Requests   Final    NONE Performed at [facility], [address] [place], [place] [address]    Culture (A)  Final    <10,000 COLONIES/mL INSIGNIFICANT GROWTH Performed at [facility], [address] [place], [place] [address]    Report Status 01/11/2022 FINAL  Final    Labs: CBC: Recent Labs  Lab 01/08/22 1007 01/09/22 0440 01/10/22 0451 01/11/22 0337 01/12/22 0749  WBC 12.3* 7.2 7.2 6.5 6.5  NEUTROABS 10.4* 5.6 5.5 4.7 4.3  HGB 14.1 9.2* 10.3* 9.9* 10.6*  HCT 41.6 27.6* 31.1* 30.4* 31.8*  MCV 77.9* 79.8* 80.4 81.1 79.5*  PLT 171 106* 145* 140* 149*   Basic Metabolic Panel: Recent Labs  Lab 01/08/22 1007 01/08/22 1200 01/08/22 1646 01/09/22 0440 01/10/22 0451 01/11/22 4008 01/12/22 6761  NA 140   < > 141 139 141 142 143  K 3.3*   < > 3.3* 3.2* 3.3* 4.1 3.7  CL 106   < > 110 110 112* 113* 112*  CO2 14*   < > 16* 16* 21* 22 18*  GLUCOSE 129*   < > 122* 99 105* 95 103*  BUN 80*   < > 81* 72* 49* 26* 17  CREATININE 4.40*   < > 3.81* 2.57* 1.22* 0.94 0.95  CALCIUM 7.8*   < > 7.9* 7.7* 8.3* 8.3* 8.6*  MG 2.7*  --   --  2.1  --   --   --    < > = values in this interval not displayed.   Liver Function Tests: Recent Labs  Lab 01/08/22 1646 01/09/22 0440 01/10/22 0451 01/11/22 0337 01/12/22 0338  AST 811* 519* 430* 290* 202*  ALT 266* 192* 180* 142* 139*  ALKPHOS 91 66 70 61 67  BILITOT 0.9 0.6 0.7 0.7 0.7  PROT 7.7 6.4* 6.7 5.9* 6.3*  ALBUMIN 3.6 2.9* 3.1* 2.7* 3.1*   CBG: No results for input(s): "GLUCAP" in the last 168 hours.  Discharge time spent: greater than 30 minutes.  Signed: Briant Cedar, MD [facility] 01/12/2022

## 2022-01-27 ENCOUNTER — Other Ambulatory Visit: Payer: Self-pay | Admitting: Internal Medicine

## 2022-01-28 LAB — BASIC METABOLIC PANEL WITH GFR
BUN: 12 mg/dL (ref 7–25)
CO2: 23 mmol/L (ref 20–32)
Calcium: 9.2 mg/dL (ref 8.6–10.4)
Chloride: 107 mmol/L (ref 98–110)
Creat: 0.96 mg/dL (ref 0.60–1.00)
Glucose, Bld: 105 mg/dL — ABNORMAL HIGH (ref 65–99)
Potassium: 3.9 mmol/L (ref 3.5–5.3)
Sodium: 141 mmol/L (ref 135–146)
eGFR: 63 mL/min/{1.73_m2} (ref >=60)

## 2022-01-28 LAB — CBC WITH DIFFERENTIAL/PLATELET
Absolute Monocytes: 580 cells/uL (ref 200–950)
Basophils Absolute: 20 cells/uL (ref 0–200)
Basophils Relative: 0.4 %
Eosinophils Absolute: 100 cells/uL (ref 15–500)
Eosinophils Relative: 2 %
HCT: 35.3 % (ref 35.0–45.0)
Hemoglobin: 12 g/dL (ref 11.7–15.5)
Lymphs Abs: 1345 cells/uL (ref 850–3900)
MCH: 26.8 pg — ABNORMAL LOW (ref 27.0–33.0)
MCHC: 34 g/dL (ref 32.0–36.0)
MCV: 79 fL — ABNORMAL LOW (ref 80.0–100.0)
MPV: 13.2 fL — ABNORMAL HIGH (ref 7.5–12.5)
Monocytes Relative: 11.6 %
Neutro Abs: 2955 cells/uL (ref 1500–7800)
Neutrophils Relative %: 59.1 %
Platelets: 214 10*3/uL (ref 140–400)
RBC: 4.47 10*6/uL (ref 3.80–5.10)
RDW: 14.4 % (ref 11.0–15.0)
Total Lymphocyte: 26.9 %
WBC: 5 10*3/uL (ref 3.8–10.8)

## 2022-08-04 ENCOUNTER — Other Ambulatory Visit: Payer: Self-pay | Admitting: Internal Medicine

## 2022-08-04 DIAGNOSIS — Z1231 Encounter for screening mammogram for malignant neoplasm of breast: Secondary | ICD-10-CM

## 2022-09-09 ENCOUNTER — Ambulatory Visit: Payer: 59

## 2022-09-16 ENCOUNTER — Ambulatory Visit
Admission: RE | Admit: 2022-09-16 | Discharge: 2022-09-16 | Disposition: A | Discharge status: Home / Self Care | Payer: 59 | Source: Ambulatory Visit | Attending: Internal Medicine | Admitting: Internal Medicine

## 2022-09-16 DIAGNOSIS — Z1231 Encounter for screening mammogram for malignant neoplasm of breast: Secondary | ICD-10-CM

## 2022-10-13 ENCOUNTER — Other Ambulatory Visit: Payer: Self-pay | Admitting: Internal Medicine

## 2022-10-13 DIAGNOSIS — E2839 Other primary ovarian failure: Secondary | ICD-10-CM

## 2022-10-28 ENCOUNTER — Other Ambulatory Visit: Payer: 59

## 2022-10-28 ENCOUNTER — Other Ambulatory Visit: Payer: Medicaid Other

## 2023-04-14 ENCOUNTER — Ambulatory Visit
Admission: RE | Admit: 2023-04-14 | Discharge: 2023-04-14 | Disposition: A | Discharge status: Home / Self Care | Payer: 59 | Source: Ambulatory Visit | Attending: Internal Medicine | Admitting: Internal Medicine

## 2023-04-14 DIAGNOSIS — E2839 Other primary ovarian failure: Secondary | ICD-10-CM

## 2023-05-18 LAB — COLOGUARD: COLOGUARD: NEGATIVE

## 2023-09-20 ENCOUNTER — Other Ambulatory Visit: Payer: Self-pay | Admitting: Internal Medicine

## 2023-09-20 DIAGNOSIS — Z1231 Encounter for screening mammogram for malignant neoplasm of breast: Secondary | ICD-10-CM

## 2023-10-13 ENCOUNTER — Ambulatory Visit

## 2023-11-07 IMAGING — MG MM DIGITAL SCREENING BILAT W/ TOMO AND CAD
8 series · 8 of 24 positions shown · non-contrast
Comparison: Previous exam(s).

CLINICAL DATA: Screening.

EXAM:
DIGITAL SCREENING BILATERAL MAMMOGRAM WITH TOMOSYNTHESIS AND CAD
TECHNIQUE: Bilateral screening digital craniocaudal and mediolateral oblique
mammograms were obtained. Bilateral screening digital breast
tomosynthesis was performed. The images were evaluated with
computer-aided detection.

[L MLO synth-2D]
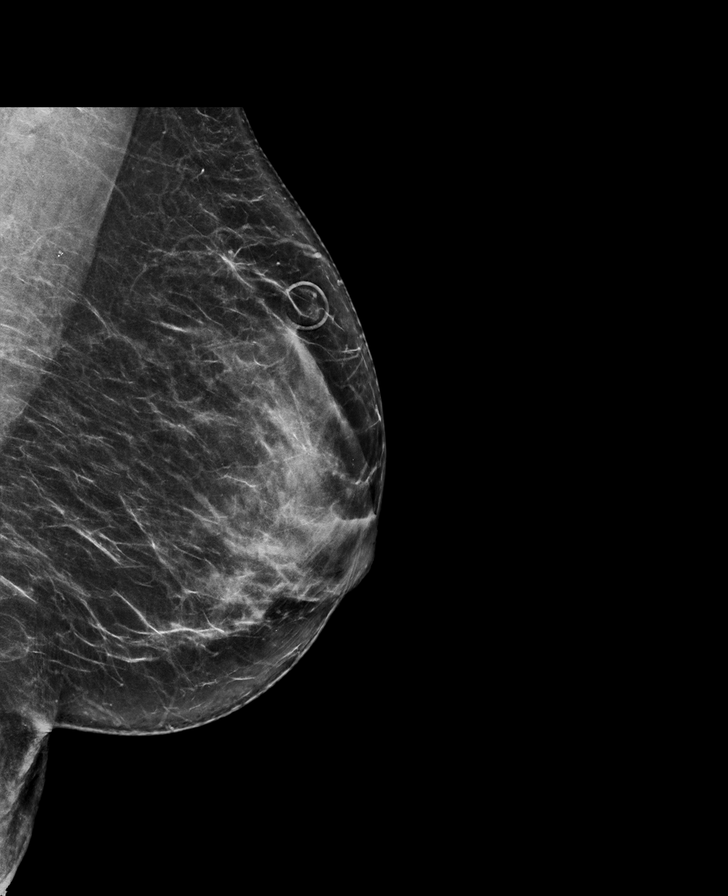

[R CC synth-2D]
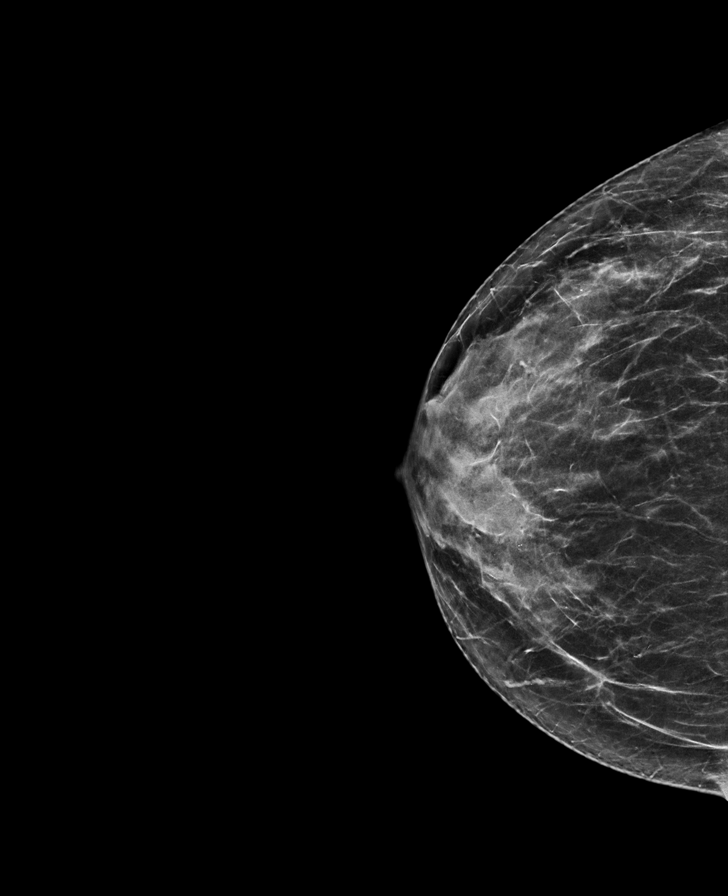

[R MLO synth-2D]
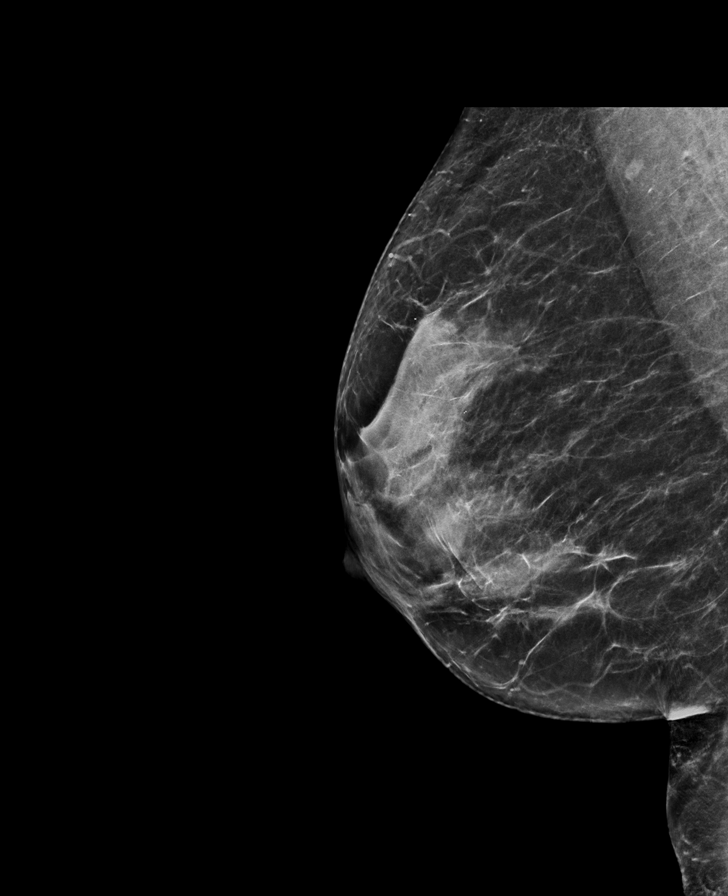

[L CC synth-2D]
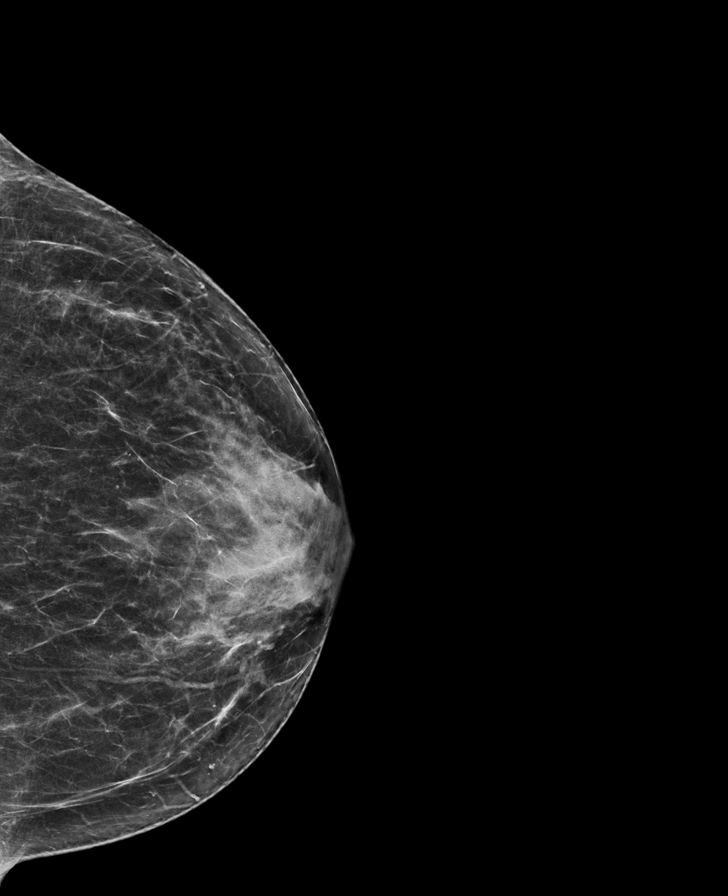

[R MLO tomo · tomo slice 36/71.0]
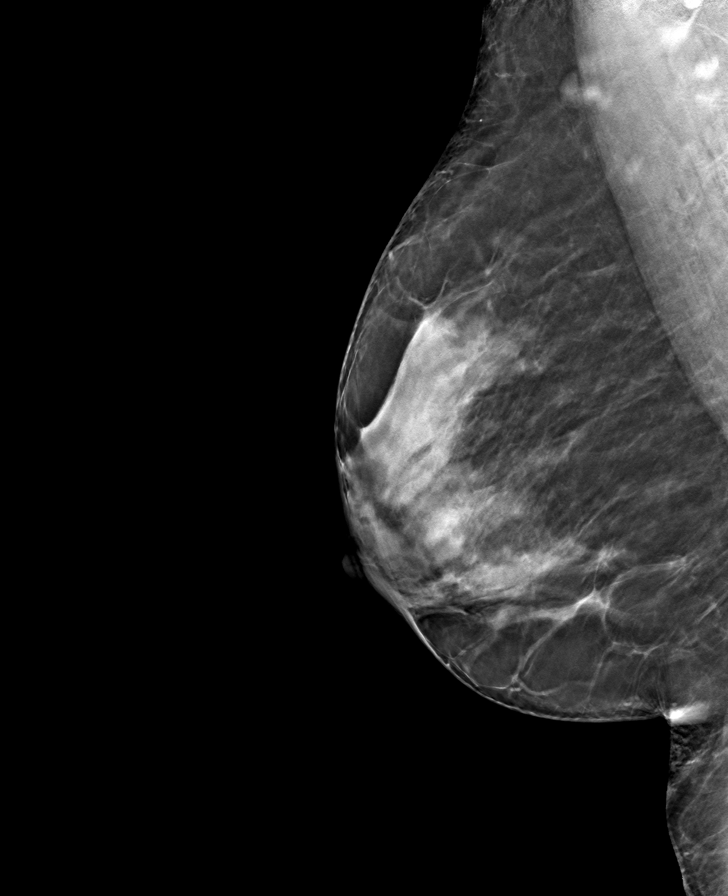

[L MLO tomo · tomo slice 38/75.0]
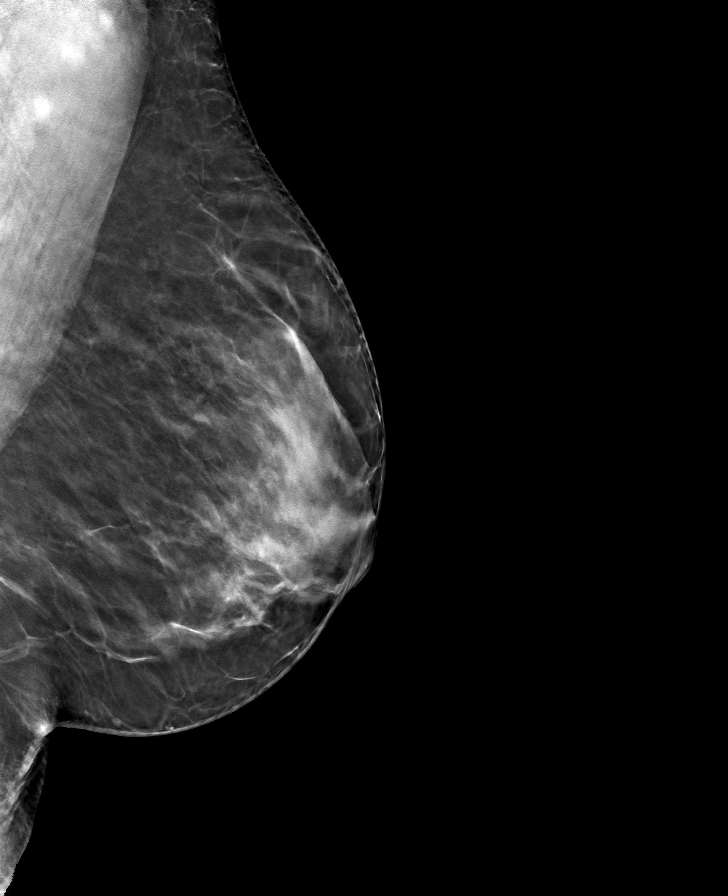

[L CC tomo · tomo slice 35/68.0]
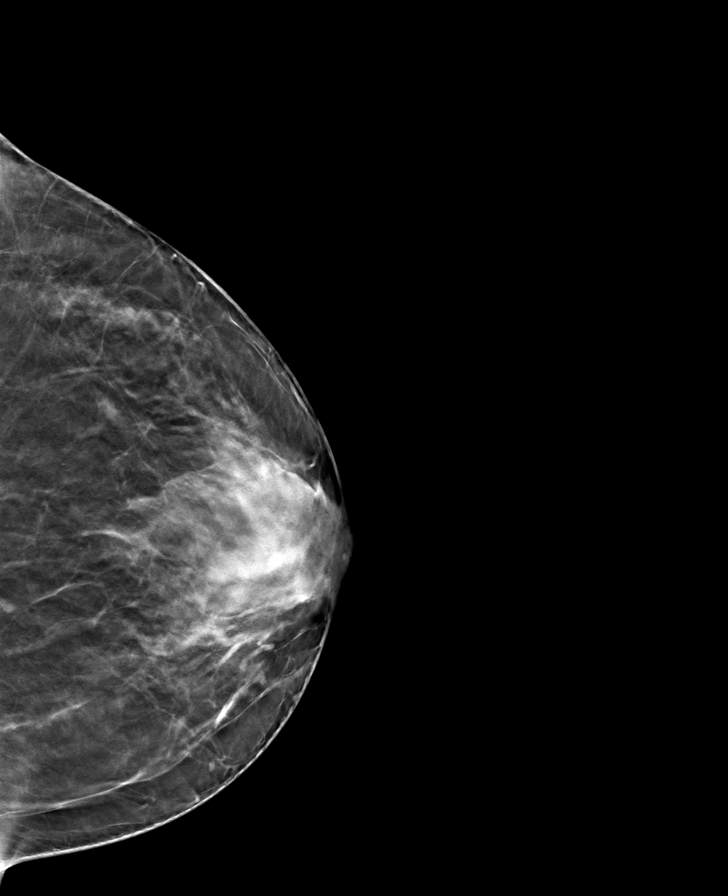

[R CC tomo · tomo slice 33/65.0]
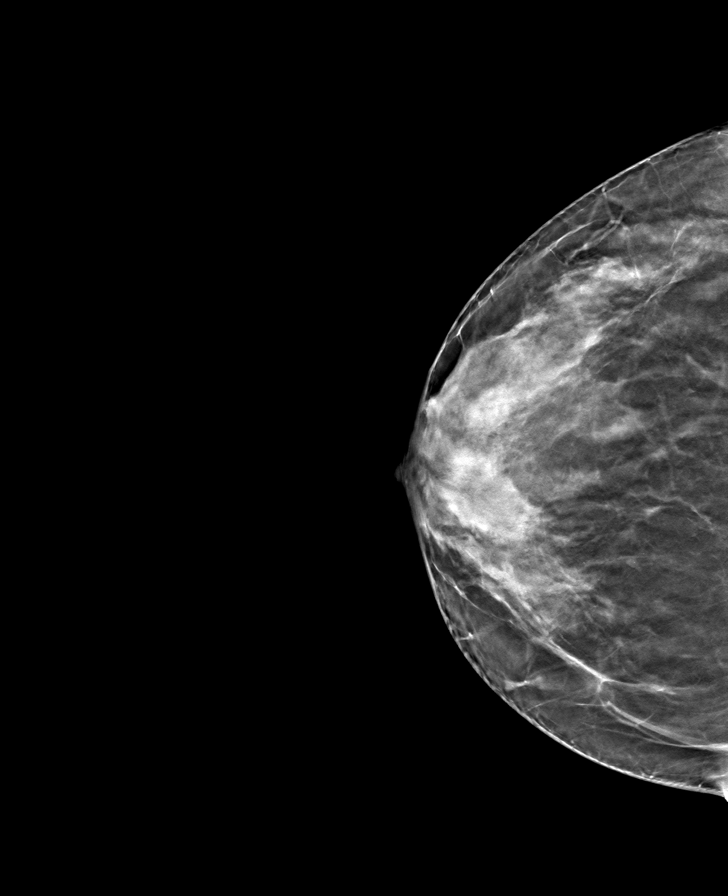

[8 of 24 positions shown; findings below may reference images not displayed]

ACR Breast Density Category c: The breast tissue is heterogeneously
dense, which may obscure small masses.
FINDINGS: There are no findings suspicious for malignancy.
IMPRESSION: No mammographic evidence of malignancy. A result letter of this
screening mammogram will be mailed directly to the patient.

RECOMMENDATION:
Screening mammogram in one year. (Code:Q3-W-BC3)

BI-RADS CATEGORY  1: Negative.

## 2023-11-17 ENCOUNTER — Ambulatory Visit

## 2023-11-17 ENCOUNTER — Ambulatory Visit
Admission: RE | Admit: 2023-11-17 | Discharge: 2023-11-17 | Disposition: A | Discharge status: Home / Self Care | Source: Ambulatory Visit | Attending: Internal Medicine | Admitting: Internal Medicine

## 2023-11-17 DIAGNOSIS — Z1231 Encounter for screening mammogram for malignant neoplasm of breast: Secondary | ICD-10-CM
